# Patient Record
Sex: Female | Born: 1955 | Race: White | Hispanic: No | Smoking: Former smoker
Health system: Southern US, Community
[De-identification: ages and names within clinical notes are randomized; demographics above are authoritative.]

## PROBLEM LIST (undated history)

## (undated) DIAGNOSIS — F419 Anxiety disorder, unspecified: Secondary | ICD-10-CM

## (undated) DIAGNOSIS — I10 Essential (primary) hypertension: Secondary | ICD-10-CM

## (undated) HISTORY — PX: OTHER SURGICAL HISTORY: SHX169

---

## 2020-07-12 ENCOUNTER — Inpatient Hospital Stay (HOSPITAL_COMMUNITY): Payer: Medicare Other

## 2020-07-12 ENCOUNTER — Inpatient Hospital Stay (HOSPITAL_COMMUNITY)
Admission: AD | Admit: 2020-07-12 | Discharge: 2020-07-15 | DRG: 280 | Disposition: A | Discharge status: Home / Self Care | Payer: Medicare Other | Source: Other Acute Inpatient Hospital | Attending: Internal Medicine | Admitting: Internal Medicine

## 2020-07-12 ENCOUNTER — Encounter (HOSPITAL_COMMUNITY): Payer: Self-pay | Admitting: Internal Medicine

## 2020-07-12 DIAGNOSIS — I5022 Chronic systolic (congestive) heart failure: Secondary | ICD-10-CM | POA: Diagnosis present

## 2020-07-12 DIAGNOSIS — G9341 Metabolic encephalopathy: Secondary | ICD-10-CM | POA: Diagnosis present

## 2020-07-12 DIAGNOSIS — R079 Chest pain, unspecified: Secondary | ICD-10-CM

## 2020-07-12 DIAGNOSIS — D72829 Elevated white blood cell count, unspecified: Secondary | ICD-10-CM | POA: Diagnosis present

## 2020-07-12 DIAGNOSIS — R569 Unspecified convulsions: Secondary | ICD-10-CM | POA: Diagnosis not present

## 2020-07-12 DIAGNOSIS — I429 Cardiomyopathy, unspecified: Secondary | ICD-10-CM | POA: Diagnosis present

## 2020-07-12 DIAGNOSIS — I10 Essential (primary) hypertension: Secondary | ICD-10-CM

## 2020-07-12 DIAGNOSIS — F32A Depression, unspecified: Secondary | ICD-10-CM | POA: Diagnosis present

## 2020-07-12 DIAGNOSIS — R296 Repeated falls: Secondary | ICD-10-CM | POA: Diagnosis present

## 2020-07-12 DIAGNOSIS — M549 Dorsalgia, unspecified: Secondary | ICD-10-CM | POA: Diagnosis present

## 2020-07-12 DIAGNOSIS — Z87891 Personal history of nicotine dependence: Secondary | ICD-10-CM

## 2020-07-12 DIAGNOSIS — G894 Chronic pain syndrome: Secondary | ICD-10-CM | POA: Diagnosis present

## 2020-07-12 DIAGNOSIS — Z981 Arthrodesis status: Secondary | ICD-10-CM

## 2020-07-12 DIAGNOSIS — I214 Non-ST elevation (NSTEMI) myocardial infarction: Secondary | ICD-10-CM | POA: Diagnosis not present

## 2020-07-12 DIAGNOSIS — F10231 Alcohol dependence with withdrawal delirium: Secondary | ICD-10-CM

## 2020-07-12 DIAGNOSIS — R778 Other specified abnormalities of plasma proteins: Secondary | ICD-10-CM

## 2020-07-12 DIAGNOSIS — G4089 Other seizures: Secondary | ICD-10-CM | POA: Diagnosis present

## 2020-07-12 DIAGNOSIS — Z20822 Contact with and (suspected) exposure to covid-19: Secondary | ICD-10-CM | POA: Diagnosis present

## 2020-07-12 DIAGNOSIS — F13239 Sedative, hypnotic or anxiolytic dependence with withdrawal, unspecified: Secondary | ICD-10-CM | POA: Diagnosis not present

## 2020-07-12 DIAGNOSIS — E785 Hyperlipidemia, unspecified: Secondary | ICD-10-CM | POA: Diagnosis present

## 2020-07-12 DIAGNOSIS — F419 Anxiety disorder, unspecified: Secondary | ICD-10-CM | POA: Diagnosis present

## 2020-07-12 DIAGNOSIS — I4581 Long QT syndrome: Secondary | ICD-10-CM | POA: Diagnosis present

## 2020-07-12 DIAGNOSIS — R7989 Other specified abnormal findings of blood chemistry: Secondary | ICD-10-CM

## 2020-07-12 DIAGNOSIS — I110 Hypertensive heart disease with heart failure: Secondary | ICD-10-CM | POA: Diagnosis present

## 2020-07-12 DIAGNOSIS — F10931 Alcohol use, unspecified with withdrawal delirium: Secondary | ICD-10-CM | POA: Diagnosis present

## 2020-07-12 DIAGNOSIS — R9431 Abnormal electrocardiogram [ECG] [EKG]: Secondary | ICD-10-CM

## 2020-07-12 DIAGNOSIS — F1110 Opioid abuse, uncomplicated: Secondary | ICD-10-CM | POA: Diagnosis present

## 2020-07-12 HISTORY — DX: Essential (primary) hypertension: I10

## 2020-07-12 HISTORY — DX: Anxiety disorder, unspecified: F41.9

## 2020-07-12 LAB — CBC WITH DIFFERENTIAL/PLATELET
Abs Immature Granulocytes: 0.26 10*3/uL — ABNORMAL HIGH (ref 0.00–0.07)
Basophils Absolute: 0.1 10*3/uL (ref 0.0–0.1)
Basophils Relative: 0 %
Eosinophils Absolute: 0 10*3/uL (ref 0.0–0.5)
Eosinophils Relative: 0 %
HCT: 42.5 % (ref 36.0–46.0)
Hemoglobin: 13.7 g/dL (ref 12.0–15.0)
Immature Granulocytes: 1 %
Lymphocytes Relative: 13 %
Lymphs Abs: 2.6 10*3/uL (ref 0.7–4.0)
MCH: 32.2 pg (ref 26.0–34.0)
MCHC: 32.2 g/dL (ref 30.0–36.0)
MCV: 99.8 fL (ref 80.0–100.0)
Monocytes Absolute: 2.2 10*3/uL — ABNORMAL HIGH (ref 0.1–1.0)
Monocytes Relative: 11 %
Neutro Abs: 15.2 10*3/uL — ABNORMAL HIGH (ref 1.7–7.7)
Neutrophils Relative %: 75 %
Platelets: 268 10*3/uL (ref 150–400)
RBC: 4.26 MIL/uL (ref 3.87–5.11)
RDW: 13.2 % (ref 11.5–15.5)
WBC: 20.3 10*3/uL — ABNORMAL HIGH (ref 4.0–10.5)
nRBC: 0 % (ref 0.0–0.2)

## 2020-07-12 LAB — CBC
HCT: 41.6 % (ref 36.0–46.0)
Hemoglobin: 13.5 g/dL (ref 12.0–15.0)
MCH: 32.3 pg (ref 26.0–34.0)
MCHC: 32.5 g/dL (ref 30.0–36.0)
MCV: 99.5 fL (ref 80.0–100.0)
Platelets: 275 10*3/uL (ref 150–400)
RBC: 4.18 MIL/uL (ref 3.87–5.11)
RDW: 13.2 % (ref 11.5–15.5)
WBC: 16.9 10*3/uL — ABNORMAL HIGH (ref 4.0–10.5)
nRBC: 0 % (ref 0.0–0.2)

## 2020-07-12 LAB — ECHOCARDIOGRAM COMPLETE
AR max vel: 3.55 cm2
AV Area VTI: 3.12 cm2
AV Area mean vel: 3.38 cm2
AV Mean grad: 3 mmHg
AV Peak grad: 5.5 mmHg
Ao pk vel: 1.17 m/s
Area-P 1/2: 8.43 cm2
Calc EF: 33.9 %
Height: 64 in
MV M vel: 3.73 m/s
MV Peak grad: 55.7 mmHg
Radius: 0.3 cm
S' Lateral: 2.6 cm
Single Plane A2C EF: 22.6 %
Single Plane A4C EF: 35.7 %
Weight: 2613.77 oz

## 2020-07-12 LAB — COMPREHENSIVE METABOLIC PANEL
ALT: 25 U/L (ref 0–44)
AST: 36 U/L (ref 15–41)
Albumin: 3 g/dL — ABNORMAL LOW (ref 3.5–5.0)
Alkaline Phosphatase: 68 U/L (ref 38–126)
Anion gap: 9 (ref 5–15)
BUN: 20 mg/dL (ref 8–23)
CO2: 22 mmol/L (ref 22–32)
Calcium: 8.6 mg/dL — ABNORMAL LOW (ref 8.9–10.3)
Chloride: 108 mmol/L (ref 98–111)
Creatinine, Ser: 0.63 mg/dL (ref 0.44–1.00)
GFR, Estimated: >60 mL/min (ref >60)
Glucose, Bld: 151 mg/dL — ABNORMAL HIGH (ref 70–99)
Potassium: 3.7 mmol/L (ref 3.5–5.1)
Sodium: 139 mmol/L (ref 135–145)
Total Bilirubin: 1.7 mg/dL — ABNORMAL HIGH (ref 0.3–1.2)
Total Protein: 6.4 g/dL — ABNORMAL LOW (ref 6.5–8.1)

## 2020-07-12 LAB — HEPARIN LEVEL (UNFRACTIONATED)
Heparin Unfractionated: 0.44 IU/mL (ref 0.30–0.70)
Heparin Unfractionated: 0.31 IU/mL (ref 0.30–0.70)
Heparin Unfractionated: 0.21 IU/mL — ABNORMAL LOW (ref 0.30–0.70)

## 2020-07-12 LAB — RAPID URINE DRUG SCREEN, HOSP PERFORMED
Amphetamines: NOT DETECTED
Barbiturates: POSITIVE — AB
Benzodiazepines: POSITIVE — AB
Cocaine: NOT DETECTED
Opiates: NOT DETECTED
Tetrahydrocannabinol: NOT DETECTED

## 2020-07-12 LAB — LIPID PANEL
Cholesterol: 154 mg/dL (ref 0–200)
HDL: 48 mg/dL (ref >40)
LDL Cholesterol: 82 mg/dL (ref 0–99)
Total CHOL/HDL Ratio: 3.2 RATIO
Triglycerides: 120 mg/dL (ref <150)
VLDL: 24 mg/dL (ref 0–40)

## 2020-07-12 LAB — HIV ANTIBODY (ROUTINE TESTING W REFLEX): HIV Screen 4th Generation wRfx: NONREACTIVE

## 2020-07-12 LAB — SARS CORONAVIRUS 2 (TAT 6-24 HRS): SARS Coronavirus 2: NEGATIVE

## 2020-07-12 LAB — HEMOGLOBIN A1C
Hgb A1c MFr Bld: 6.4 % — ABNORMAL HIGH (ref 4.8–5.6)
Mean Plasma Glucose: 136.98 mg/dL

## 2020-07-12 LAB — TROPONIN I (HIGH SENSITIVITY): Troponin I (High Sensitivity): 338 ng/L (ref <18)

## 2020-07-12 LAB — TSH: TSH: 0.416 u[IU]/mL (ref 0.350–4.500)

## 2020-07-12 MED ORDER — CARVEDILOL 6.25 MG PO TABS
6.2500 mg | ORAL_TABLET | Freq: Two times a day (BID) | ORAL | Status: DC
Start: 2020-07-12 — End: 2020-07-12
  Administered 2020-07-12: 6.25 mg via ORAL
  Filled 2020-07-12: qty 1

## 2020-07-12 MED ORDER — POTASSIUM CHLORIDE CRYS ER 20 MEQ PO TBCR
40.0000 meq | EXTENDED_RELEASE_TABLET | Freq: Once | ORAL | Status: AC
  Administered 2020-07-12: 40 meq via ORAL
  Filled 2020-07-12: qty 2

## 2020-07-12 MED ORDER — HYDRALAZINE HCL 10 MG PO TABS
10.0000 mg | ORAL_TABLET | Freq: Three times a day (TID) | ORAL | Status: DC
  Administered 2020-07-12 – 2020-07-15 (×11): 10 mg via ORAL
  Filled 2020-07-12 (×11): qty 1

## 2020-07-12 MED ORDER — ATORVASTATIN CALCIUM 10 MG PO TABS
10.0000 mg | ORAL_TABLET | Freq: Every day | ORAL | Status: DC
  Administered 2020-07-12 – 2020-07-15 (×4): 10 mg via ORAL
  Filled 2020-07-12 (×4): qty 1

## 2020-07-12 MED ORDER — LORAZEPAM 2 MG/ML IJ SOLN
1.0000 mg | Freq: Once | INTRAMUSCULAR | Status: AC
  Administered 2020-07-12: 1 mg via INTRAVENOUS
  Filled 2020-07-12: qty 1

## 2020-07-12 MED ORDER — ACETAMINOPHEN 325 MG PO TABS
650.0000 mg | ORAL_TABLET | Freq: Four times a day (QID) | ORAL | Status: DC | PRN
  Administered 2020-07-13: 650 mg via ORAL
  Filled 2020-07-12: qty 2

## 2020-07-12 MED ORDER — NITROGLYCERIN 0.4 MG SL SUBL: 0.4000 mg | SUBLINGUAL_TABLET | SUBLINGUAL | Status: DC | PRN

## 2020-07-12 MED ORDER — LOSARTAN POTASSIUM 25 MG PO TABS
25.0000 mg | ORAL_TABLET | Freq: Every day | ORAL | Status: DC
  Administered 2020-07-12 – 2020-07-15 (×4): 25 mg via ORAL
  Filled 2020-07-12 (×3): qty 1

## 2020-07-12 MED ORDER — ASPIRIN EC 81 MG PO TBEC
81.0000 mg | DELAYED_RELEASE_TABLET | Freq: Every day | ORAL | Status: DC
  Administered 2020-07-12 – 2020-07-15 (×4): 81 mg via ORAL
  Filled 2020-07-12 (×4): qty 1

## 2020-07-12 MED ORDER — THIAMINE HCL 100 MG/ML IJ SOLN: 100.0000 mg | Freq: Every day | INTRAMUSCULAR | Status: DC

## 2020-07-12 MED ORDER — CARVEDILOL 12.5 MG PO TABS
12.5000 mg | ORAL_TABLET | Freq: Two times a day (BID) | ORAL | Status: DC
Start: 2020-07-12 — End: 2020-07-15
  Administered 2020-07-12 – 2020-07-15 (×6): 12.5 mg via ORAL
  Filled 2020-07-12 (×6): qty 1

## 2020-07-12 MED ORDER — HEPARIN (PORCINE) 25000 UT/250ML-% IV SOLN
1200.0000 [IU]/h | INTRAVENOUS | Status: AC
  Administered 2020-07-12: 900 [IU]/h via INTRAVENOUS
  Administered 2020-07-12: 950 [IU]/h via INTRAVENOUS
  Filled 2020-07-12 (×2): qty 250

## 2020-07-12 MED ORDER — HALOPERIDOL LACTATE 5 MG/ML IJ SOLN: 2.0000 mg | Freq: Four times a day (QID) | INTRAMUSCULAR | Status: DC | PRN

## 2020-07-12 MED ORDER — ADULT MULTIVITAMIN W/MINERALS CH
1.0000 | ORAL_TABLET | Freq: Every day | ORAL | Status: DC
  Administered 2020-07-13 – 2020-07-15 (×3): 1 via ORAL
  Filled 2020-07-12 (×3): qty 1

## 2020-07-12 MED ORDER — THIAMINE HCL 100 MG PO TABS
100.0000 mg | ORAL_TABLET | Freq: Every day | ORAL | Status: DC
  Administered 2020-07-13: 100 mg via ORAL
  Filled 2020-07-12: qty 1

## 2020-07-12 MED ORDER — MORPHINE SULFATE (PF) 2 MG/ML IV SOLN
1.0000 mg | INTRAVENOUS | Status: DC | PRN
  Administered 2020-07-12: 1 mg via INTRAVENOUS
  Filled 2020-07-12: qty 1

## 2020-07-12 MED ORDER — PERFLUTREN LIPID MICROSPHERE
1.0000 mL | INTRAVENOUS | Status: AC | PRN
  Administered 2020-07-12: 1 mL via INTRAVENOUS
  Filled 2020-07-12: qty 10

## 2020-07-12 MED ORDER — MAGNESIUM SULFATE 2 GM/50ML IV SOLN
2.0000 g | Freq: Once | INTRAVENOUS | Status: AC
  Administered 2020-07-12: 2 g via INTRAVENOUS
  Filled 2020-07-12: qty 50

## 2020-07-12 MED ORDER — LORAZEPAM 1 MG PO TABS
1.0000 mg | ORAL_TABLET | ORAL | Status: DC | PRN
  Administered 2020-07-13: 2 mg via ORAL
  Administered 2020-07-14: 4 mg via ORAL
  Administered 2020-07-14: 1 mg via ORAL
  Administered 2020-07-15: 4 mg via ORAL
  Administered 2020-07-15: 1 mg via ORAL
  Filled 2020-07-12 (×2): qty 1
  Filled 2020-07-12: qty 2
  Filled 2020-07-12 (×2): qty 4

## 2020-07-12 MED ORDER — ACETAMINOPHEN 650 MG RE SUPP: 650.0000 mg | Freq: Four times a day (QID) | RECTAL | Status: DC | PRN

## 2020-07-12 MED ORDER — LORAZEPAM 2 MG/ML IJ SOLN
1.0000 mg | INTRAMUSCULAR | Status: DC | PRN
Start: 2020-07-12 — End: 2020-07-15
  Administered 2020-07-12 – 2020-07-13 (×3): 2 mg via INTRAVENOUS
  Filled 2020-07-12 (×3): qty 1

## 2020-07-12 MED ORDER — FOLIC ACID 1 MG PO TABS
1.0000 mg | ORAL_TABLET | Freq: Every day | ORAL | Status: DC
  Administered 2020-07-13 – 2020-07-15 (×3): 1 mg via ORAL
  Filled 2020-07-12 (×4): qty 1

## 2020-07-12 NOTE — Progress Notes (Addendum)
MD - Please write order to continue foley.  Pt was admitted to [facility] with foley catheter in place, but no order.

## 2020-07-12 NOTE — Progress Notes (Signed)
EEG complete - results pending 

## 2020-07-12 NOTE — Progress Notes (Signed)
Patient admitted to 3e20 via [facility]. Pt attached to tele, on 2L O2. Pt disoriented to place and situation, constantly trying to get out of bed. Falls precautions in place. MD notified of patient arrival.

## 2020-07-12 NOTE — Progress Notes (Addendum)
Progress Note  Patient Name: Helen Mitchell Date of Encounter: 07/12/2020  [facility] Cardiologist: New to [facility]  Subjective   Slumped down in the bed. She thought she was at [facility] hospital but otherwise is oriented to time and person. She does seem confused, insisting we had met previously, though this is our first interaction. She denies recent chest pain, SOB, or palpitations. She recalls falling out of bed as the reason she was brought to the hospital. She attributes her ongoing shakiness to not receiving her xanax.   Inpatient Medications    Scheduled Meds: . aspirin EC  81 mg Oral Daily  . atorvastatin  10 mg Oral Daily  . carvedilol  6.25 mg Oral BID WC  . hydrALAZINE  10 mg Oral Q8H   Continuous Infusions: . heparin 900 Units/hr (07/12/20 0144)   PRN Meds: acetaminophen **OR** acetaminophen, haloperidol lactate, morphine injection, nitroGLYCERIN   Vital Signs    Vitals:   07/12/20 0047 07/12/20 0438 07/12/20 0821  BP: (!) 139/108 (!) 139/101 (!) 149/106  Pulse: (!) 107 (!) 116 (!) 117  Resp: (!) 21 18   Temp: 98.5 F (36.9 C) 98.4 F (36.9 C) 98 F (36.7 C)  TempSrc: Oral Oral Oral  SpO2: 91% 95% 93%  Weight: 74.1 kg    Height: '5\' 4"'  (1.626 m)      Intake/Output Summary (Last 24 hours) at 07/12/2020 4327 Last data filed at 07/12/2020 0100 Gross per 24 hour  Intake --  Output 100 ml  Net -100 ml   Last 3 Weights 07/12/2020  Weight (lbs) 163 lb 5.8 oz  Weight (kg) 74.1 kg      Telemetry    Sinus tachycardia with rates in the 110s-120s, occasional PVCs - Personally Reviewed  ECG    No new tracings - Personally Reviewed  Physical Exam   GEN: No acute distress.   Neck: No JVD Cardiac: tachycardic, regular rate, no murmurs, rubs, or gallops.  Respiratory: paradoxical breathing; she would not roll over or sit up for proper pulmonary exam due to back pain. No obvious wheezing, rhonchi, or rales anteriorly GI: Soft, obese, nontender,  non-distended  MS: No edema; No deformity. Neuro:  Nonfocal  Psych: Normal affect   Labs    High Sensitivity Troponin:   Recent Labs  Lab 07/12/20 0309  TROPONINIHS 338*      Chemistry Recent Labs  Lab 07/12/20 0309  NA 139  K 3.7  CL 108  CO2 22  GLUCOSE 151*  BUN 20  CREATININE 0.63  CALCIUM 8.6*  PROT 6.4*  ALBUMIN 3.0*  AST 36  ALT 25  ALKPHOS 68  BILITOT 1.7*  GFRNONAA >60  ANIONGAP 9     Hematology Recent Labs  Lab 07/12/20 0309  WBC 20.3*  RBC 4.26  HGB 13.7  HCT 42.5  MCV 99.8  MCH 32.2  MCHC 32.2  RDW 13.2  PLT 268    BNPNo results for input(s): BNP, PROBNP in the last 168 hours.   DDimer No results for input(s): DDIMER in the last 168 hours.   Radiology    DG CHEST PORT 1 VIEW  Result Date: 07/12/2020 CLINICAL DATA:  Chest pain EXAM: PORTABLE CHEST 1 VIEW COMPARISON:  07/10/2020 FINDINGS: Cardiac shadow is mildly enlarged. The lungs are well aerated bilaterally. No focal infiltrate or sizable effusion is seen. No pneumothorax is noted. Old rib fractures are noted. No acute abnormality seen. IMPRESSION: No acute abnormality noted. Electronically Signed   By: Elta Guadeloupe  Lukens M.D.   On: 07/12/2020 01:40    Cardiac Studies   Echocardiogram pending  Patient Profile     65 y.o. female with PMH of HTN, HLD, ETOH abuse, polysubstance abuse, and frequent falls, who transferred from [facility] to [facility] for the evaluation of elevated troponins after presenting post-fall from bed with reported generalized shaking and AMS.    Assessment & Plan    1. Elevated troponin in patient with no known CAD history: patient found to have HsTrop elevated to [phone] after a fall out of bed and possible seizure-like activity. No complaints of chest pain though reportedly noted some palpitations. EKG showed sinus tachycardia with marked anterolateral TWI and QTc 521. Suspect she is a poor candidate for invasive ischemic testing due to ongoing AMS, polysubstance  abuse, fall risk, and concerns for compliance. Favor conservative management of possible NSTEMI    - Echo pending to evaluate LV function and wall motion - Continue heparin gtt x 48 hours - Continue aspirin and statin - Continue carvedilol - Will add on FLP and A1C to morning labs for risk stratification  2. Prolonged QTc: elevated to 521 on EKG this admission. Home cymbalta on hold  - Continue to avoid QTc prolonging medications - would limit haldol is possible.  - Continue to monitor daily EKG - Will replete K 40 mEq this morning for K 3.7 - continue to monitor electrolytes and replete as needed to maintain K >4, Mg >2  3. HTN: BP persistently above goal.  - Continue home carvedilol for now - further titration pending above work-up.   4. HLD: no recent lipid panel on file - Will add on FLP to morning labs - Continue atorvastatin - low threshold to uptitrate if LDL >70 in light of #1  5. Leukocytosis: WBC up to 20 today. She has been afebrile. CXR without acute findings. UA/ UCx pending collection.  - Will defer infectious work-up to primary team  6. Polysubstance abuse: Utox positive for benzo's, opiates, and barbiturates. ETOH levels wnl. Amonia mildly elevated - Continue management per primary team.  For questions or updates, please contact [place] Please consult [email or web address] for contact info under     Signed, Abigail Butts, PA-C  07/12/2020, 9:37 AM    The patient was seen, examined and discussed with Abigail Butts, PA-C  and I agree with the above.   The patient became combative this morning and almost fell off the bed, she received Ativan, on physical exam she responds to questions but does not open her eyes, she is only oriented to herself.  Denies any chest pain or shortness of breath. She has no JVDs, S1-S2, no significant murmur, clear lungs, warm extremities with good peripheral pulses.  Assessment and plan:  Alcohol abuse, currently in  withdrawal -Withdrawal management per primary team, Ativan, fluids vitamin B1 strongly recommended  Polysubstance abuse  Troponin elevation  [phone] -This was found after seizure activity that can be related to alcohol withdrawal -I have reviewed her echocardiogram that shows akinesis in all of the apical segments and mid anteroseptal inferoseptal and anterior walls consistent with either occlusion or severe stenosis in proximal LAD, there is no definite apical thrombus however heavy smoke consistent with prethrombotic state -LVEF 30 to 35%.  Grade 2 diastolic dysfunction with elevated filling pressures. -Her EKG does show significant negative T waves in the anterolateral leads -The patient is currently asymptomatic, denies any chest pain or shortness of breath, she is currently actively withdrawing from  alcohol she is not a good candidate for cardiac catheterization and certainly not able to give [place] consent for that, however she will eventually need cardiac cath -I would complete 48-hour of heparin infusion and then consider full anticoagulation for prethrombotic state high risk of LV apical thrombus formation, depending on how her mental status evolves, risk and benefits of her compliance should be taken into consideration.  QT prolongation -QT/QTc 380/520 milliseconds -She was on multiple medications at home that could cause QT prolongation including Zofran, elitriptan, the patient is unable to provide any history if she taken any of those lately and how many, also an answer question about drug use, however avoid these in the hospital.  Hypertension -Increase carvedilol to 12.5 mg p.o. twice daily -Add losartan 25 mg daily.  Hyperlipidemia -Statin, LFTs are normal   Ena Dawley, MD 07/12/2020

## 2020-07-12 NOTE — Procedures (Signed)
HISTORY The patient is a 65 year old woman with a history of hypertension, chronic pain, polysubstance abuse, who is being evaluated for seizure-like episodes.   TECHNICAL This study was performed according to the 10-20 International System of Electrode Placement. This record was reviewed under the following settings: bandpass filters of 1-70 Hz, sensitivity of 7 uV/mm, a display speed of 30 mm/sec, with a 60 Hz notched filter applied as appropriate.   MEDICATIONS Keppra    FINDINGS This study was recorded in the awake state only. A posterior dominant background rhythm of 9-10 Hz is seen at times. Continuous frontotemporal muscle artifact and movement artifact obscures underlying activity throughout the entirety of the study.    HYPERVENTILATION: Not performed PHOTIC STIMULATION: Not performed   IMPRESSION This is an essentially nondiagnostic study recorded in the awake state only. Continuous frontotemporal muscle artifact and movement artifact obscures underlying cerebral activity. Consider repeating study when patient is able to cooperate.

## 2020-07-12 NOTE — Progress Notes (Addendum)
Update received from [facility]. Pt continued to be confused and combative. Versed 5mg  given during [facility] transport from [facility], after Haldol IM was given by [facility] staff.

## 2020-07-12 NOTE — H&P (Signed)
History and Physical    Helen Mitchell OHY:[id number] DOB: [birth year] DOA: 07/12/2020  PCP: Pcp, No  Patient coming from: Patient was transferred from [facility].  Chief Complaint: Confusion.  HPI: Helen Mitchell is a 65 y.o. female with history of hypertension, chronic pain and history of polysubstance abuse was brought to the ER after patient had a seizure-like episode as witnessed by patient's significant other.  In the ER patient was found to be confused and was given Ativan.  CT head was unremarkable ammonia level was around 41.  LFTs are mildly elevated.  Covid test was negative.  Chest x-ray did not show anything acute.  EKG showed diffuse T wave inversions in anterior leads.  Troponin was 400.  Patient urine drug screen was positive for opiates barbiturates benzodiazepine.  Patient was started on aspirin and heparin and was discussed with cardiologist on-call at [facility] and transferred for further management.  Patient did not complain of any chest pain but did complain of palpitations.    On my initial encounter with the patient patient is alert and awake and knows where she was and stated that she did not have chest pain but has having palpitation for the last few weeks.  Denies any shortness of breath nausea vomiting abdominal pain.  But did not exactly remember why she came to the ER.  He denies drinking any alcohol or using drugs.  Does take pain medications.    Patient was admitted last month on May 18, 2020 for stable burst fractures of the vertebra at [facility]  Shortly after admission patient slightly became confused and agitated was given Ativan.  ED Course: Patient is a direct admit.  Review of Systems: As per HPI, rest all negative.   Past Medical History:  Diagnosis Date  . Anxiety   . Hypertension     Past Surgical History:  Procedure Laterality Date  . spinal fusion       reports that she has quit smoking. She has never used smokeless tobacco. She reports  that she does not drink alcohol. No history on file for drug use.  Not on File  Family History  Problem Relation Age of Onset  . CAD Neg Hx     Prior to Admission medications   Not on File    Physical Exam: Constitutional: Moderately built and nourished. Vitals:   07/12/20 0047  BP: (!) 139/108  Pulse: (!) 107  Resp: (!) 21  Temp: 98.5 F (36.9 C)  TempSrc: Oral  SpO2: 91%  Weight: 74.1 kg  Height: 5\' 4"  (1.626 m)   Eyes: Anicteric no pallor. ENMT: No discharge from the ears eyes nose or mouth. Neck: No mass felt.  No neck rigidity. Respiratory: No rhonchi or crepitations. Cardiovascular: S1-S2 heard. Abdomen: Soft nontender bowel sounds present. Musculoskeletal: No edema. Skin: No rash. Neurologic: Alert awake oriented to time place and person.  Moves all extremities. Psychiatric: Appears normal.  Normal affect.   Labs on Admission: I have personally reviewed following labs and imaging studies  CBC: No results for input(s): WBC, NEUTROABS, HGB, HCT, MCV, PLT in the last 168 hours. Basic Metabolic Panel: No results for input(s): NA, K, CL, CO2, GLUCOSE, BUN, CREATININE, CALCIUM, MG, PHOS in the last 168 hours. GFR: CrCl cannot be calculated (No successful lab value found.). Liver Function Tests: No results for input(s): AST, ALT, ALKPHOS, BILITOT, PROT, ALBUMIN in the last 168 hours. No results for input(s): LIPASE, AMYLASE in the last 168 hours. No results  for input(s): AMMONIA in the last 168 hours. Coagulation Profile: No results for input(s): INR, PROTIME in the last 168 hours. Cardiac Enzymes: No results for input(s): CKTOTAL, CKMB, CKMBINDEX, TROPONINI in the last 168 hours. BNP (last 3 results) No results for input(s): PROBNP in the last 8760 hours. HbA1C: No results for input(s): HGBA1C in the last 72 hours. CBG: No results for input(s): GLUCAP in the last 168 hours. Lipid Profile: No results for input(s): CHOL, HDL, LDLCALC, TRIG, CHOLHDL,  LDLDIRECT in the last 72 hours. Thyroid Function Tests: No results for input(s): TSH, T4TOTAL, FREET4, T3FREE, THYROIDAB in the last 72 hours. Anemia Panel: No results for input(s): VITAMINB12, FOLATE, FERRITIN, TIBC, IRON, RETICCTPCT in the last 72 hours. Urine analysis: No results found for: COLORURINE, APPEARANCEUR, LABSPEC, PHURINE, GLUCOSEU, HGBUR, BILIRUBINUR, KETONESUR, PROTEINUR, UROBILINOGEN, NITRITE, LEUKOCYTESUR Sepsis Labs: @LABRCNTIP (procalcitonin:4,lacticidven:4) )No results found for this or any previous visit (from the past 240 hour(s)).   Radiological Exams on Admission: No results found.  EKG: Independently reviewed.  EKG done at [facility] shows normal sinus rhythm with diffuse T wave inversion anterior leads.  Assessment/Plan Principal Problem:   NSTEMI (non-ST elevated myocardial infarction) (HCC) Active Problems:   Elevated troponin   Essential hypertension   Non-STEMI (non-ST elevated myocardial infarction) (HCC)    1. Elevated troponins with diffuse T wave inversions in the anterior leads concerning for non-ST relation MI presently on heparin aspirin beta-blockers and statins.  Consult cardiology.  We will keep patient n.p.o. in anticipation of procedure. 2. Acute encephalopathy with concern for seizures.  On my exam patient was answering questions.  Will check MRI brain and EEG.  May need neurology input.  Presently patient is afebrile. 3. Hypertension on beta-blockers. 4. History of chronic pain we will need to verify home medications. 5. History of polysubstance abuse according to the chart.  We will need to contact patient's significant other when available to get further history. Patient's home medications needs to be verified.  Since patient has elevated troponin with diffuse T wave changes in the EKG with acute encephalopathy cause of which is not clear will need close monitoring and inpatient status.  All labs are pending   DVT prophylaxis: Heparin  infusion. Code Status: Full code. Family Communication: We will need to reach family when contact number is available. Disposition Plan: To be determined. Consults called: Cardiology. Admission status: Inpatient.   [facility] MD [facility] Pager [phone].  If 7PM-7AM, please contact night-coverage [email or web address] Password [email or web address]  07/12/2020, 1:05 AM

## 2020-07-12 NOTE — Progress Notes (Addendum)
ANTICOAGULATION CONSULT NOTE - Initial Consult  Pharmacy Consult for heparin Indication: chest pain/ACS  Not on File  Patient Measurements: Height: 5\' 4"  (162.6 cm) Weight: 74.1 kg (163 lb 5.8 oz) IBW/kg (Calculated) : 54.7 Heparin Dosing Weight: 70kg  Vital Signs: Temp: 98.5 F (36.9 C) (03/31 0047) Temp Source: Oral (03/31 0047) BP: 139/108 (03/31 0047) Pulse Rate: 107 (03/31 0047)  Medical History: Past Medical History:  Diagnosis Date  . Anxiety   . Hypertension     Assessment: 65yo female transferred to [facility] for further cardiac w/u, started on heparin by OSH for CP, to continue on transfer.  Labs from OSH: WBC 20.1, Hgb 15, Plt 271, SCr 0.67.  Goal of Therapy:  Heparin level 0.3-0.7 units/ml Monitor platelets by anticoagulation protocol: Yes   Plan:  Pt was given heparin 2500 units IV bolus followed by gtt at 900 units/hr; will continue for now and monitor heparin levels and CBC.  [facility], PharmD, BCPS  07/12/2020,1:11 AM   Addendum: Heparin level at goal, 0.44.  Will continue heparin at same rate and confirm stable with additional level.  VB 5:16 AM

## 2020-07-12 NOTE — Progress Notes (Signed)
[facility] night shift.  The staff reported that the patient has been restless.  She was given 1 mg of lorazepam IVP 0845.  There was a question about the use of benzodiazepines as the patient might be withdrawing from alprazolam, but also has a history of alcohol consumption.  The case was discussed with neurology.  Her EEG was nondiagnostic.  She is supposed to get MRI of brain during this hospitalization as well.  Her EKG showed QT interval prolongation so I am unable to consider haloperidol at this time and will order another milligram of lorazepam IVP.  Magnesium sulfate 2 g IVPB ordered for QT interval prolongation and history of alcohol use.  Sanda Klein, MD.

## 2020-07-12 NOTE — Consult Note (Signed)
[facility] Consult Note   Primary Physician: No PCP listed Primary Cardiologist:  NONE  Reason for Consultation: Elevated troponin  HPI:    Helen Mitchell is a 65 year old female who presented to [facility] ED with altered mental status.  Apparently she has a history of hypertension, hyperlipidemia, EtOH abuse, polysubstance abuse and frequent falls.  Patient has followed up in the pain clinic and takes Percocets 4 times per day for chronic pain.  Per patient's caregiver she was noted to fall out of bed and had some generalized shaking prior to coming to the hospital.  In the ED the patient continued to be agitated and restless.  She required multiple doses of Ativan.  Slightly increased ammonia level was noted on her labs.  The patient was transferred to [facility] for further care.  CT Head (07/10/20) - Negative High-sensitivity troponins were 462 and 737. Urine rapid drug screen was positive for benzodiazepines opiates, oxycodone and barbiturates. Ethanol <3.0 Other labs were: K 3.0, BUN 16, Cr 0.73, Gluc 186, WBC 18.7, Hct 49.7 and Plt 102. Chest x-ray showed no acute abnormality. ECG on 07/11/2020 (6:10 PM) -documented sinus tachycardia, low voltage QRS, poor R progression, and lateral T wave changes.   Home Medications Prior to Admission medications   Not on File    Past Medical History: Past Medical History:  Diagnosis Date  . Anxiety   . Hypertension     Past Surgical History: Past Surgical History:  Procedure Laterality Date  . spinal fusion      Family History: Family History  Problem Relation Age of Onset  . CAD Neg Hx     Social History: Social History   Socioeconomic History  . Marital status: Divorced    Spouse name: Not on file  . Number of children: Not on file  . Years of education: Not on file  . Highest education level: Not on file  Occupational History  . Not on file  Tobacco Use  . Smoking status: Former Games developer  .  Smokeless tobacco: Never Used  Substance and Sexual Activity  . Alcohol use: Never  . Drug use: Not on file  . Sexual activity: Not on file  Other Topics Concern  . Not on file  Social History Narrative  . Not on file   Social Determinants of Health   Financial Resource Strain: Not on file  Food Insecurity: Not on file  Transportation Needs: Not on file  Physical Activity: Not on file  Stress: Not on file  Social Connections: Not on file    Allergies:  Not on File   Review of Systems: [y] = yes, [ ]  = no   . General: Weight gain [ ] ; Weight loss [ ] ; Anorexia [ ] ; Fatigue [ ] ; Fever [ ] ; Chills [ ] ; Weakness [ ]   . Cardiac: Chest pain/pressure [ ] ; Resting SOB [ ] ; Exertional SOB [ ] ; Orthopnea [ ] ; Pedal Edema [ ] ; Palpitations [ ] ; Syncope [ ] ; Presyncope [ ] ; Paroxysmal nocturnal dyspnea[ ]   . Pulmonary: Cough [ ] ; Wheezing[ ] ; Hemoptysis[ ] ; Sputum [ ] ; Snoring [ ]   . GI: Vomiting[ ] ; Dysphagia[ ] ; Melena[ ] ; Hematochezia [ ] ; Heartburn[ ] ; Abdominal pain [ ] ; Constipation [ ] ; Diarrhea [ ] ; BRBPR [ ]   . GU: Hematuria[ ] ; Dysuria [ ] ; Nocturia[ ]   . Vascular: Pain in legs with walking [ ] ; Pain in feet with lying flat [ ] ; Non-healing sores [ ] ; Stroke [ ] ;  TIA [ ] ; Slurred speech [ ] ;  . Neuro: Headaches[ ] ; Vertigo[ ] ; Seizures[ ] ; Paresthesias[ ] ;Blurred vision [ ] ; Diplopia [ ] ; Vision changes [ ]   . Ortho/Skin: Arthritis [ ] ; Joint pain [ ] ; Muscle pain [ ] ; Joint swelling [ ] ; Back Pain [ ] ; Rash [ ]   . Psych: Depression[ ] ; Anxiety[ ] : Altered Mental Status [Y]  . Heme: Bleeding problems [ ] ; Clotting disorders [ ] ; Anemia [ ]   . Endocrine: Diabetes [ ] ; Thyroid dysfunction[ ]      Objective:    Vital Signs:   Temp:  [98.5 F (36.9 C)] 98.5 F (36.9 C) (03/31 0047) Pulse Rate:  [107] 107 (03/31 0047) Resp:  [21] 21 (03/31 0047) BP: (139)/(108) 139/108 (03/31 0047) SpO2:  [91 %] 91 % (03/31 0047) Weight:  [74.1 kg] 74.1 kg (03/31 0047)    Weight  change: Filed Weights   07/12/20 0047  Weight: 74.1 kg    Intake/Output:   Intake/Output Summary (Last 24 hours) at 07/12/2020 0242 Last data filed at 07/12/2020 0100 Gross per 24 hour  Intake --  Output 100 ml  Net -100 ml      Physical Exam    General: Restless, confused HEENT: normal Neck: supple. JVP . Carotids 2+ bilat; no bruits. No lymphadenopathy or thyromegaly appreciated. Cor: Regular rate rhythm, no murmurs Lungs: Clear to auscultation bilaterally Abdomen: soft, nondistended. No hepatosplenomegaly. No bruits or masses. Extremities: no cyanosis, clubbing, rash, edema Neuro: Nonfocal exam    Labs   Basic Metabolic Panel: No results for input(s): NA, K, CL, CO2, GLUCOSE, BUN, CREATININE, CALCIUM, MG, PHOS in the last 168 hours.  Liver Function Tests: No results for input(s): AST, ALT, ALKPHOS, BILITOT, PROT, ALBUMIN in the last 168 hours. No results for input(s): LIPASE, AMYLASE in the last 168 hours. No results for input(s): AMMONIA in the last 168 hours.  CBC: No results for input(s): WBC, NEUTROABS, HGB, HCT, MCV, PLT in the last 168 hours.  Cardiac Enzymes: No results for input(s): CKTOTAL, CKMB, CKMBINDEX, TROPONINI in the last 168 hours.  BNP: BNP (last 3 results) No results for input(s): BNP in the last 8760 hours.  ProBNP (last 3 results) No results for input(s): PROBNP in the last 8760 hours.   CBG: No results for input(s): GLUCAP in the last 168 hours.  Coagulation Studies: No results for input(s): LABPROT, INR in the last 72 hours.   Imaging   DG CHEST PORT 1 VIEW  Result Date: 07/12/2020 CLINICAL DATA:  Chest pain EXAM: PORTABLE CHEST 1 VIEW COMPARISON:  07/10/2020 FINDINGS: Cardiac shadow is mildly enlarged. The lungs are well aerated bilaterally. No focal infiltrate or sizable effusion is seen. No pneumothorax is noted. Old rib fractures are noted. No acute abnormality seen. IMPRESSION: No acute abnormality noted. Electronically  Signed   By: M.D.   On: 07/12/2020 01:40      Medications:     Current Medications: . aspirin EC  81 mg Oral Daily  . atorvastatin  10 mg Oral Daily  . carvedilol  6.25 mg Oral BID WC  . hydrALAZINE  10 mg Oral Q8H     Infusions: . heparin 900 Units/hr (07/12/20 0144)       Assessment/Plan   1. Elevated troponin  Patient presents to the hospital with altered mental status.  There is a prior history of alcohol and polysubstance use.  There is some documentation of seizure-like activity at home though in the hospital she did not exhibit  this. No reports of chest pain. High-sensitivity troponins were drawn and were abnormal (462 and 737). ECG with T wave inversions in anterolateral leads. Also QTc 521 msec  -Continue to trend enzymes -Serial ECGs -ASA 81 mg daily -Check a lipid panel -Continue statins -Optimize BP control, resume home dose of carvedilol -Consider unfractionated heparin IV infusion for 48 hours -Echo to evaluate LV function  2. QT prolongation  -Maintain serum K>4.0 and Mg>2.0 -Monitor on telemetry -Avoid QT prolonging medications    Lonie Peak, MD  07/12/2020, 2:42 AM  Cardiology Overnight Team Please contact [facility] Cardiology for night-coverage after hours (4p -7a ) and weekends on [email or web address]

## 2020-07-12 NOTE — Progress Notes (Signed)
Pt admitted to 3e20 from [facility]. [facility] admitting paged.

## 2020-07-12 NOTE — Consult Note (Signed)
Neurology Consultation  Reason for Consult: AMS and concern for seizures Referring Physician: Dorcas Carrow, MD  CC: Confusion  History is obtained from: chart and patient   HPI: Danise Dehne is a 65 y.o. female hypertension, chronic pain and history of polysubstance abuse. Mrs Jonita Albee presented to [facility] after a seizure like episode described below.   3/29- [facility] HPI "Vernell Back is a 65 y.o. female with PMHx as reviewed in the EMR that presented to [facility] and is being admitted for Altered mental status. Per EMS pt's caregiver/friend witnessed pt having a seizure and she fell out of bed. Caregiver states that she was asleep and woke up with generalized shaking around 1 AM, rolled over and then fell out of bed. He states pt had a seizure in February and was brought here, she followed up with her PCP who did not feel need for her to be started on antiseizure medications. On ER work-up CT showed no acute abnormality and urine drug screen positive for opiates, barbiturates, benzodiazepine and oxycodone. On my encounter patient is less restless and keeps asking to go home. She does know that she is at the hospital but could not tell me the year or month." Patient was provided a loading dose of Keppra; however Keppra was not continued as there was low suspicion for organic seizures.  Patient was noted to have elevated troponin with EKG changes and was  transferred from [facility] for further cardiac management.  On exam today patient is alert to person, time, place, and situation. Patient reports that she has no never had seizures prior to 2 months ago. Patient reports that she did have a seizure in 05/2020 that resulted in her being in the hospital for 5 days. On further chart review patient did report to [facility] in 05/2020, but EMR does not report seizure and references LOC after a fall resulting in closed compression fracture. Per EMR there is no other evidence of seizure  like events. However her boyfriend/caretaker told admitting staff at [facility] that the patient was recommended to follow up with her OP physician after the 05/2020 hospitalization, and he did not feel the need to start AEDs. Patient reports that her only medication change was discontinuation of her chronic Xanax for anxiety approximately 2 months ago. Patient reports that she has not taken it since her PCP discontinued. However, patient UDS at [facility] was positive for benzo, opiates, oxy, and barbiturates. Patient was adamant that she did not know where the benzo and barbiturates would have come from.  Patient endorses that she feels better today.    ROS: A 14 point ROS was performed and is negative except as noted in the HPI.   Past Medical History:  Diagnosis Date  . Anxiety   . Hypertension      Family History  Problem Relation Age of Onset  . CAD Neg Hx     Social History:   reports that she has quit smoking. She has never used smokeless tobacco. She reports that she does not drink alcohol. No history on file for drug use.  Medications  Current Facility-Administered Medications:  .  acetaminophen (TYLENOL) tablet 650 mg, 650 mg, Oral, Q6H PRN **OR** acetaminophen (TYLENOL) suppository 650 mg, 650 mg, Rectal, Q6H PRN, Eduard Clos, MD .  aspirin EC tablet 81 mg, 81 mg, Oral, Daily, Eduard Clos, MD, 81 mg at 07/12/20 5916 .  atorvastatin (LIPITOR) tablet 10 mg, 10 mg, Oral, Daily,  Eduard Clos, MD, 10 mg at 07/12/20 1443 .  carvedilol (COREG) tablet 12.5 mg, 12.5 mg, Oral, BID WC, Tobias Alexander H, MD .  heparin ADULT infusion 100 units/mL (25000 units/243mL), 900 Units/hr, Intravenous, Continuous, Bryk, Veronda P, RPH, Last Rate: 9 mL/hr at 07/12/20 1052, 900 Units/hr at 07/12/20 1052 .  hydrALAZINE (APRESOLINE) tablet 10 mg, 10 mg, Oral, Q8H, Eduard Clos, MD, 10 mg at 07/12/20 0453 .  losartan (COZAAR) tablet 25 mg, 25 mg, Oral, Daily, Tobias Alexander H, MD .  morphine 2 MG/ML injection 1 mg, 1 mg, Intravenous, Q2H PRN, Eduard Clos, MD, 1 mg at 07/12/20 1540 .  nitroGLYCERIN (NITROSTAT) SL tablet 0.4 mg, 0.4 mg, Sublingual, Q5 min PRN, Eduard Clos, MD .  potassium chloride SA (KLOR-CON) CR tablet 40 mEq, 40 mEq, Oral, Once, Kroeger, Krista M., PA-C   Exam: Current vital signs: BP (!) 149/106   Pulse (!) 117   Temp 98 F (36.7 C) (Oral)   Resp 18   Ht 5\' 4"  (1.626 m)   Wt 74.1 kg   SpO2 93%   BMI 28.04 kg/m  Vital signs in last 24 hours: Temp:  [98 F (36.7 C)-98.5 F (36.9 C)] 98 F (36.7 C) (03/31 0821) Pulse Rate:  [107-117] 117 (03/31 0821) Resp:  [18-21] 18 (03/31 0438) BP: (139-149)/(101-108) 149/106 (03/31 0821) SpO2:  [91 %-95 %] 93 % (03/31 0821) Weight:  [74.1 kg] 74.1 kg (03/31 0047)  GENERAL: Awake, alert in NAD HEENT: - Normocephalic and atraumatic, dry mm,  LUNGS - Normal respiratory effort. SaO2 CV - RRR on tele ABDOMEN - Soft, nontender Ext: warm, well perfused  NEURO:  Mental Status: AA&O to person, place, time, and situation Speech/Language: speech is clear and coherent.   fluency, and comprehension intact. Cranial Nerves:  II: PERRL_2___mm/brisk. visual fields full.  III, IV, VI: EOMI. Lid elevation symmetric and full.  V: Sensation is intact to light touch and symmetrical to face. Blinks to threat.  VII: Face is symmetric resting and smiling. Able to puff cheeks and raise eyebrows.  VIII: Hearing intact to voice IX, X: Palate elevation is symmetric. Phonation normal.  XI: Normal sternocleidomastoid and trapezius muscle strength XII: Tongue protrudes midline without fasciculations.   Motor: 5/5 strength is all muscle groups.  Tone is normal. Bulk is normal.  Sensation- Intact to light touch bilaterally in all four extremities Coordination: FTN intact bilaterally. DTRs: 2+ throughout.  Gait- deferred, patient on EEG   Labs I have reviewed labs in epic and the  results pertinent to this consultation are:   CBC    Component Value Date/Time   WBC 20.3 (H) 07/12/2020 0309   RBC 4.26 07/12/2020 0309   HGB 13.7 07/12/2020 0309   HCT 42.5 07/12/2020 0309   PLT 268 07/12/2020 0309   MCV 99.8 07/12/2020 0309   MCH 32.2 07/12/2020 0309   MCHC 32.2 07/12/2020 0309   RDW 13.2 07/12/2020 0309   LYMPHSABS 2.6 07/12/2020 0309   MONOABS 2.2 (H) 07/12/2020 0309   EOSABS 0.0 07/12/2020 0309   BASOSABS 0.1 07/12/2020 0309    CMP     Component Value Date/Time   NA 139 07/12/2020 0309   K 3.7 07/12/2020 0309   CL 108 07/12/2020 0309   CO2 22 07/12/2020 0309   GLUCOSE 151 (H) 07/12/2020 0309   BUN 20 07/12/2020 0309   CREATININE 0.63 07/12/2020 0309   CALCIUM 8.6 (L) 07/12/2020 0309   PROT 6.4 (L) 07/12/2020 0309  ALBUMIN 3.0 (L) 07/12/2020 0309   AST 36 07/12/2020 0309   ALT 25 07/12/2020 0309   ALKPHOS 68 07/12/2020 0309   BILITOT 1.7 (H) 07/12/2020 0309   GFRNONAA >60 07/12/2020 0309    Lipid Panel     Component Value Date/Time   CHOL 154 07/12/2020 1037   TRIG 120 07/12/2020 1037   HDL 48 07/12/2020 1037   CHOLHDL 3.2 07/12/2020 1037   VLDL 24 07/12/2020 1037   LDLCALC 82 07/12/2020 1037     Imaging EEG pending MRI Brain pending   Impression: Mrs. Mauch is a 65 yo patient who presents with concern for seizure-like event. Patient UDS on her initial presentation was positive to suggest polypharmacy. Patient endorses recent discontinuation of Xanax however her UDS was positive for benzo's. If patient is stopping and starting benzos this is placing her at increased risk for seizures. This is also true for barbiturates as withdrawal from barbiturates also increases risk for seizures. Patient appears to be clearing some of her medications (prescribed opioids) and her mental status appears to be improving although it may be waxing and waning. Patient will likely continue to improve as she clears these substances. Will still await MRI  and EEG results as patient has had recent falls and a hematoma has been seen on prior head imaging that could increase risk for non polypharmacy  Induced seizures as well.   Recommendations: - EEG pending, start AEDs if ictal episodes seen - MRI Brain, pending - CIWA protocol - Delirium precautions  Seizure precautions:  Per [facility] statutes, patients with seizures are not allowed to drive until they have been seizure-free for six months and cleared by a physician    Use caution when using heavy equipment or power tools. Avoid working on ladders or at heights. Take showers instead of baths. Ensure the water temperature is not too high on the home water heater. Do not go swimming alone. Do not lock yourself in a room alone (i.e. bathroom). When caring for infants or small children, sit down when holding, feeding, or changing them to minimize risk of injury to the child in the event you have a seizure. Maintain good sleep hygiene. Avoid alcohol.    If patient has another seizure, call 911 and bring them back to the ED if: A.  The seizure lasts longer than 5 minutes.      B.  The patient doesn't wake shortly after the seizure or has new problems such as difficulty seeing, speaking or moving following the seizure C.  The patient was injured during the seizure D.  The patient has a temperature over 102 F (39C) E.  The patient vomited during the seizure and now is having trouble breathing    During the Seizure   - First, ensure adequate ventilation and place patients on the floor on their left side  Loosen clothing around the neck and ensure the airway is patent. If the patient is clenching the teeth, do not force the mouth open with any object as this can cause severe damage - Remove all items from the surrounding that can be hazardous. The patient may be oblivious to what's happening and may not even know what he or she is doing. If the patient is confused and wandering, either gently  guide him/her away and block access to outside areas - Reassure the individual and be comforting - Call 911. In most cases, the seizure ends before EMS arrives. However, there are cases when seizures may  last over 3 to 5 minutes. Or the individual may have developed breathing difficulties or severe injuries. If a pregnant patient or a person with diabetes develops a seizure, it is prudent to call an ambulance. - Finally, if the patient does not regain full consciousness, then call EMS. Most patients will remain confused for about 45 to 90 minutes after a seizure, so you must use judgment in calling for help. - Avoid restraints but make sure the patient is in a bed with padded side rails - Place the individual in a lateral position with the neck slightly flexed; this will help the saliva drain from the mouth and prevent the tongue from falling backward - Remove all nearby furniture and other hazards from the area - Provide verbal assurance as the individual is regaining consciousness - Provide the patient with privacy if possible - Call for help and start treatment as ordered by the caregiver    After the Seizure (Postictal Stage)   After a seizure, most patients experience confusion, fatigue, muscle pain and/or a headache. Thus, one should permit the individual to sleep. For the next few days, reassurance is essential. Being calm and helping reorient the person is also of importance.   Most seizures are painless and end spontaneously. Seizures are not harmful to others but can lead to complications such as stress on the lungs, brain and the heart. Individuals with prior lung problems may develop labored breathing and respiratory distress.        Eliseo GumJai Jaonna Word, MD PGY-1

## 2020-07-12 NOTE — Progress Notes (Signed)
Patient seen and examined.  Admitted early morning hours by nighttime hospitalist.  I reviewed her records.  I did talk to her boyfriend and caretaker [name] on the phone as well as her daughter Ms. Tori at the bedside.  Patient had been intermittently confused and impulsive.  65 year old female with history of hypertension, chronic pain syndrome on chronic pain management with Percocet, previously on Xanax that was stopped about a month ago and is still intermittently taking, on TCA and SSRI had witnessed seizure-like activity at home and 1 more similar episode at  [facility].  Patient was confused.  She only tells [place] that she fell off the bed.  On initial presentation to emergency room, patient was found to have minimally elevated troponins, started on heparin drip and transferred to [facility].  Patient did have multiple falls, recent 1 about a month ago resulting in vertebral fractures.  On board pain management since then.  Patient denies any use of phenobarbital, street drugs or alcohol.  #1. non-ST elevation MI: Diffuse T wave changes on presentation.  Patient with no history of cardiac stent.  Chest pain-free.  Followed by cardiology. Plan for heparinization for 48 hours, no cardiac cath planned. Resumed her home doses of aspirin and statin as well as beta-blockers.  Mobilize. Echocardiogram pending.  #2.  Suspected seizure/postictal/polypharmacy: Witnessed seizure-like activity, will rule out seizure.  Possible Xanax withdrawal. Seizure precautions. MRI today, EEG today.  Discussed with neurology for recommendations. Will not resume any benzodiazepines. Patient is on long-term TCA and Cymbalta, will resume.  Resume Percocet on lower doses, she is on chronic pain management. Symptomatic treatment for confusion agitation, possible postictal.  Fall precautions.

## 2020-07-12 NOTE — Progress Notes (Signed)
ANTICOAGULATION CONSULT NOTE - Follow Up Consult  Pharmacy Consult for Heparin Indication: chest pain/ACS  No Known Allergies  Patient Measurements: Height: 5\' 4"  (162.6 cm) Weight: 74.1 kg (163 lb 5.8 oz) IBW/kg (Calculated) : 54.7 Heparin Dosing Weight: 70 kg  Vital Signs: Temp: 97.8 F (36.6 C) (03/31 2337) Temp Source: Oral (03/31 2337) BP: 112/82 (03/31 2337) Pulse Rate: 104 (03/31 2337)  Labs: Recent Labs    07/12/20 0309 07/12/20 1037 07/12/20 2308  HGB 13.7  --  13.5  HCT 42.5  --  41.6  PLT 268  --  275  HEPARINUNFRC 0.44 0.31 0.21*  CREATININE 0.63  --   --   TROPONINIHS 338*  --   --     Estimated Creatinine Clearance: 68.3 mL/min (by C-G formula based on SCr of 0.63 mg/dL).   Assessment: 65yo female transferred to [facility] for further cardiac w/u, started on heparin by OSH for CP, which was continued on transfer last night/early AM.    Heparin level down to subtherapeutic (0.21) on gtt at 950 units/hr. RN states that pt has been pulling out line all day long and during this shift. Heparin currently restarted and running. No bleeding noted.  Goal of Therapy:  Heparin level 0.3-0.7 units/ml Monitor platelets by anticoagulation protocol: Yes   Plan:  Continue heparin gtt at 950 units/hr Will f/u 6 hr heparin level  [facility], PharmD, BCPS Please see amion for complete clinical pharmacist phone list 07/12/2020,11:57 PM

## 2020-07-12 NOTE — Progress Notes (Signed)
ANTICOAGULATION CONSULT NOTE - Follow Up Consult  Pharmacy Consult for Heparin Indication: chest pain/ACS  No Known Allergies  Patient Measurements: Height: 5\' 4"  (162.6 cm) Weight: 74.1 kg (163 lb 5.8 oz) IBW/kg (Calculated) : 54.7 Heparin Dosing Weight: 70 kg  Vital Signs: Temp: 98 F (36.7 C) (03/31 1214) Temp Source: Oral (03/31 1214) BP: 142/98 (03/31 1214) Pulse Rate: 109 (03/31 1214)  Labs: Recent Labs    07/12/20 0309 07/12/20 1037  HGB 13.7  --   HCT 42.5  --   PLT 268  --   HEPARINUNFRC 0.44 0.31  CREATININE 0.63  --   TROPONINIHS 338*  --     Estimated Creatinine Clearance: 68.3 mL/min (by C-G formula based on SCr of 0.63 mg/dL).   Medications:  Medications Prior to Admission  Medication Sig Dispense Refill Last Dose  . alendronate (FOSAMAX) 70 MG tablet Take 70 mg by mouth once a week. Saturday   07/07/2020  . b complex vitamins capsule Take 1 capsule by mouth daily.   07/09/2020  . Biotin 10 MG TABS Take 10 mg by mouth daily at 6 (six) AM.   07/09/2020  . carvedilol (COREG) 6.25 MG tablet Take 6.25 mg by mouth 2 (two) times daily.   07/09/2020 at 2000  . Cholecalciferol (VITAMIN D) 125 MCG (5000 UT) CAPS Take 5,000 Units by mouth daily.   07/09/2020  . DULoxetine (CYMBALTA) 60 MG capsule Take 60 mg by mouth 2 (two) times daily.   07/09/2020  . eletriptan (RELPAX) 40 MG tablet Take 40 mg by mouth 2 (two) times daily as needed for migraine.   Past Month at Unknown time  . hydrochlorothiazide (MICROZIDE) 12.5 MG capsule Take 12.5 mg by mouth daily.   07/09/2020  . lactulose (CHRONULAC) 10 GM/15ML solution Take 10 g by mouth 2 (two) times daily.   07/07/2020  . ondansetron (ZOFRAN-ODT) 4 MG disintegrating tablet Take 4 mg by mouth 2 (two) times daily as needed for nausea.   Past Month at Unknown time  . oxyCODONE-acetaminophen (PERCOCET) 10-325 MG tablet Take 1 tablet by mouth 5 (five) times daily as needed for pain.   07/09/2020  . oxymetazoline (AFRIN) 0.05 % nasal  spray Place 1 spray into both nostrils 2 (two) times daily as needed for congestion.   Past Month at Unknown time  . rosuvastatin (CRESTOR) 10 MG tablet Take 10 mg by mouth daily.   07/09/2020  . tiZANidine (ZANAFLEX) 4 MG tablet Take 4 mg by mouth 4 (four) times daily as needed for muscle spasms.   07/09/2020  . VENTOLIN HFA 108 (90 Base) MCG/ACT inhaler Inhale 2 puffs into the lungs every 4 (four) hours as needed for shortness of breath.   07/09/2020    Assessment: 65yo female transferred to [facility] for further cardiac w/u, started on heparin by OSH for CP, which was continued on transfer last night/early AM.   Initial heparin level post start of hepairn drip was therapeutic.   Second confirmatory heparin level is 0.31 remains therapeutic on heparin drip 900 units/hr. Heparin level did decrease from 0.44 to 0.31, low end of goal thus will bump heparin rate up slightly some to keep HL within goal.  No bleeding noted. CBC within normal   Goal of Therapy:  Heparin level 0.3-0.7 units/ml Monitor platelets by anticoagulation protocol: Yes   Plan:  Increase heparin drip rate to 950 units/hr  Daily heparin level and CBC Monitor for signs and symptoms of bleeding  Thank you for allowing pharmacy  to be part of this patients care team.  Noah Delaine, RPh Clinical Pharmacist [phone] Please check AMION for all [facility] phone numbers After 10:00 PM, call Main Pharmacy [phone] 07/12/2020,1:06 PM

## 2020-07-12 NOTE — Progress Notes (Signed)
COVID-19 PCR and Respiratory Pathogen Panel negative on 07/10/20 at 03:01am per medical chart from [facility].

## 2020-07-13 ENCOUNTER — Inpatient Hospital Stay (HOSPITAL_COMMUNITY): Payer: Medicare Other

## 2020-07-13 DIAGNOSIS — I214 Non-ST elevation (NSTEMI) myocardial infarction: Secondary | ICD-10-CM | POA: Diagnosis not present

## 2020-07-13 LAB — PHOSPHORUS
Phosphorus: 2.2 mg/dL — ABNORMAL LOW (ref 2.5–4.6)
Phosphorus: 2.2 mg/dL — ABNORMAL LOW (ref 2.5–4.6)

## 2020-07-13 LAB — COMPREHENSIVE METABOLIC PANEL
ALT: 33 U/L (ref 0–44)
AST: 54 U/L — ABNORMAL HIGH (ref 15–41)
Albumin: 3.1 g/dL — ABNORMAL LOW (ref 3.5–5.0)
Alkaline Phosphatase: 69 U/L (ref 38–126)
Anion gap: 11 (ref 5–15)
BUN: 22 mg/dL (ref 8–23)
CO2: 22 mmol/L (ref 22–32)
Calcium: 9 mg/dL (ref 8.9–10.3)
Chloride: 106 mmol/L (ref 98–111)
Creatinine, Ser: 0.7 mg/dL (ref 0.44–1.00)
GFR, Estimated: >60 mL/min (ref >60)
Glucose, Bld: 132 mg/dL — ABNORMAL HIGH (ref 70–99)
Potassium: 3.6 mmol/L (ref 3.5–5.1)
Sodium: 139 mmol/L (ref 135–145)
Total Bilirubin: 1.6 mg/dL — ABNORMAL HIGH (ref 0.3–1.2)
Total Protein: 6.5 g/dL (ref 6.5–8.1)

## 2020-07-13 LAB — HEPARIN LEVEL (UNFRACTIONATED)
Heparin Unfractionated: 0.25 IU/mL — ABNORMAL LOW (ref 0.30–0.70)
Heparin Unfractionated: 0.35 IU/mL (ref 0.30–0.70)
Heparin Unfractionated: 0.27 IU/mL — ABNORMAL LOW (ref 0.30–0.70)

## 2020-07-13 LAB — VITAMIN B12: Vitamin B-12: 395 pg/mL (ref 180–914)

## 2020-07-13 LAB — AMMONIA: Ammonia: 26 umol/L (ref 9–35)

## 2020-07-13 LAB — MAGNESIUM
Magnesium: 3.2 mg/dL — ABNORMAL HIGH (ref 1.7–2.4)
Magnesium: 2.5 mg/dL — ABNORMAL HIGH (ref 1.7–2.4)

## 2020-07-13 MED ORDER — VITAMIN D 25 MCG (1000 UNIT) PO TABS
5000.0000 [IU] | ORAL_TABLET | Freq: Every day | ORAL | Status: DC
  Administered 2020-07-13 – 2020-07-15 (×3): 5000 [IU] via ORAL
  Filled 2020-07-13 (×3): qty 5

## 2020-07-13 MED ORDER — DULOXETINE HCL 60 MG PO CPEP: 60.0000 mg | ORAL_CAPSULE | Freq: Two times a day (BID) | ORAL | Status: DC

## 2020-07-13 MED ORDER — OXYMETAZOLINE HCL 0.05 % NA SOLN
1.0000 | Freq: Two times a day (BID) | NASAL | Status: DC | PRN
  Filled 2020-07-13: qty 30

## 2020-07-13 MED ORDER — LACTULOSE 10 GM/15ML PO SOLN
10.0000 g | Freq: Two times a day (BID) | ORAL | Status: DC
  Administered 2020-07-13 – 2020-07-15 (×5): 10 g via ORAL
  Filled 2020-07-13 (×5): qty 15

## 2020-07-13 MED ORDER — THIAMINE HCL 100 MG/ML IJ SOLN
500.0000 mg | Freq: Three times a day (TID) | INTRAVENOUS | Status: DC
  Administered 2020-07-13 – 2020-07-14 (×6): 500 mg via INTRAVENOUS
  Filled 2020-07-13 (×8): qty 5

## 2020-07-13 MED ORDER — POTASSIUM CHLORIDE CRYS ER 20 MEQ PO TBCR
40.0000 meq | EXTENDED_RELEASE_TABLET | Freq: Every day | ORAL | Status: DC
  Administered 2020-07-13 – 2020-07-15 (×3): 40 meq via ORAL
  Filled 2020-07-13 (×3): qty 2

## 2020-07-13 MED ORDER — OXYCODONE-ACETAMINOPHEN 5-325 MG PO TABS
1.0000 | ORAL_TABLET | Freq: Three times a day (TID) | ORAL | Status: DC | PRN
Start: 2020-07-13 — End: 2020-07-14
  Administered 2020-07-13 – 2020-07-14 (×2): 1 via ORAL
  Filled 2020-07-13 (×2): qty 1

## 2020-07-13 MED ORDER — OXYCODONE HCL 5 MG PO TABS: 5.0000 mg | ORAL_TABLET | Freq: Three times a day (TID) | ORAL | Status: DC | PRN

## 2020-07-13 MED ORDER — B COMPLEX VITAMINS PO CAPS: 1.0000 | ORAL_CAPSULE | Freq: Every day | ORAL | Status: DC

## 2020-07-13 MED ORDER — OXYCODONE-ACETAMINOPHEN 10-325 MG PO TABS: 1.0000 | ORAL_TABLET | Freq: Every day | ORAL | Status: DC | PRN

## 2020-07-13 MED ORDER — ELETRIPTAN HYDROBROMIDE 20 MG PO TABS: 40.0000 mg | ORAL_TABLET | Freq: Two times a day (BID) | ORAL | Status: DC | PRN

## 2020-07-13 MED ORDER — POTASSIUM PHOSPHATES 15 MMOLE/5ML IV SOLN
20.0000 mmol | Freq: Once | INTRAVENOUS | Status: DC
  Filled 2020-07-13 (×2): qty 6.67

## 2020-07-13 MED ORDER — OXYCODONE-ACETAMINOPHEN 10-325 MG PO TABS: 1.0000 | ORAL_TABLET | Freq: Three times a day (TID) | ORAL | Status: DC | PRN

## 2020-07-13 MED ORDER — ALENDRONATE SODIUM 70 MG PO TABS: 70.0000 mg | ORAL_TABLET | ORAL | Status: DC

## 2020-07-13 MED ORDER — ALBUTEROL SULFATE HFA 108 (90 BASE) MCG/ACT IN AERS
2.0000 | INHALATION_SPRAY | RESPIRATORY_TRACT | Status: DC | PRN
  Filled 2020-07-13: qty 6.7

## 2020-07-13 MED ORDER — DULOXETINE HCL 30 MG PO CPEP
30.0000 mg | ORAL_CAPSULE | Freq: Two times a day (BID) | ORAL | Status: DC
  Administered 2020-07-13 – 2020-07-14 (×3): 30 mg via ORAL
  Filled 2020-07-13 (×3): qty 1

## 2020-07-13 MED ORDER — BIOTIN 10 MG PO TABS: 10.0000 mg | ORAL_TABLET | Freq: Every day | ORAL | Status: DC

## 2020-07-13 NOTE — Progress Notes (Signed)
Progress Note  Patient Name: Helen Mitchell Date of Encounter: 07/13/2020  Primary Cardiologist:   No primary care provider on file.   Subjective   Very confused.  Complains of back pain.    Inpatient Medications    Scheduled Meds: . aspirin EC  81 mg Oral Daily  . atorvastatin  10 mg Oral Daily  . carvedilol  12.5 mg Oral BID WC  . folic acid  1 mg Oral Daily  . hydrALAZINE  10 mg Oral Q8H  . losartan  25 mg Oral Daily  . multivitamin with minerals  1 tablet Oral Daily  . thiamine  100 mg Oral Daily   Or  . thiamine  100 mg Intravenous Daily   Continuous Infusions: . heparin 950 Units/hr (07/12/20 1943)   PRN Meds: acetaminophen **OR** acetaminophen, LORazepam **OR** LORazepam, morphine injection, nitroGLYCERIN   Vital Signs    Vitals:   07/12/20 1938 07/12/20 2337 07/13/20 0350 07/13/20 0357  BP: 127/89 112/82  (!) 137/97  Pulse: (!) 104 (!) 104  (!) 107  Resp: 18 20  20   Temp: 98.2 F (36.8 C) 97.8 F (36.6 C)  97.9 F (36.6 C)  TempSrc: Oral Oral  Oral  SpO2: 94% 99%  95%  Weight:   73.9 kg   Height:        Intake/Output Summary (Last 24 hours) at 07/13/2020 0820 Last data filed at 07/13/2020 0804 Gross per 24 hour  Intake 175.51 ml  Output 550 ml  Net -374.49 ml   Filed Weights   07/12/20 0047 07/13/20 0350  Weight: 74.1 kg 73.9 kg    Telemetry    NSR - Personally Reviewed  ECG    NA - Personally Reviewed  Physical Exam   GEN: No acute distress.   Neck: No  JVD Cardiac: RRR, no murmurs, rubs, or gallops.  Respiratory: Clear  to auscultation bilaterally. GI: Soft, nontender, non-distended  MS: No  edema; No deformity. Neuro:  Nonfocal  Psych:     Very confused.   Labs    Chemistry Recent Labs  Lab 07/12/20 0309  NA 139  K 3.7  CL 108  CO2 22  GLUCOSE 151*  BUN 20  CREATININE 0.63  CALCIUM 8.6*  PROT 6.4*  ALBUMIN 3.0*  AST 36  ALT 25  ALKPHOS 68  BILITOT 1.7*  GFRNONAA >60  ANIONGAP 9     Hematology Recent Labs   Lab 07/12/20 0309 07/12/20 2308  WBC 20.3* 16.9*  RBC 4.26 4.18  HGB 13.7 13.5  HCT 42.5 41.6  MCV 99.8 99.5  MCH 32.2 32.3  MCHC 32.2 32.5  RDW 13.2 13.2  PLT 268 275    Cardiac EnzymesNo results for input(s): TROPONINI in the last 168 hours. No results for input(s): TROPIPOC in the last 168 hours.   BNPNo results for input(s): BNP, PROBNP in the last 168 hours.   DDimer No results for input(s): DDIMER in the last 168 hours.   Radiology    DG CHEST PORT 1 VIEW  Result Date: 07/12/2020 CLINICAL DATA:  Chest pain EXAM: PORTABLE CHEST 1 VIEW COMPARISON:  07/10/2020 FINDINGS: Cardiac shadow is mildly enlarged. The lungs are well aerated bilaterally. No focal infiltrate or sizable effusion is seen. No pneumothorax is noted. Old rib fractures are noted. No acute abnormality seen. IMPRESSION: No acute abnormality noted. Electronically Signed   By: 07/12/2020 M.D.   On: 07/12/2020 01:40   EEG adult  Result Date: 07/12/2020 07/14/2020, MD  07/12/2020  7:13 PM HISTORY The patient is a 65 year old woman with a history of hypertension, chronic pain, polysubstance abuse, who is being evaluated for seizure-like episodes.  TECHNICAL This study was performed according to the 10-20 International System of Electrode Placement. This record was reviewed under the following settings: bandpass filters of 1-70 Hz, sensitivity of 7 uV/mm, a display speed of 30 mm/sec, with a 60 Hz notched filter applied as appropriate.  MEDICATIONS Keppra  FINDINGS This study was recorded in the awake state only. A posterior dominant background rhythm of 9-10 Hz is seen at times. Continuous frontotemporal muscle artifact and movement artifact obscures underlying activity throughout the entirety of the study.  HYPERVENTILATION: Not performed PHOTIC STIMULATION: Not performed  IMPRESSION This is an essentially nondiagnostic study recorded in the awake state only. Continuous frontotemporal muscle artifact and movement  artifact obscures underlying cerebral activity. Consider repeating study when patient is able to cooperate.    ECHOCARDIOGRAM COMPLETE  Result Date: 07/12/2020    ECHOCARDIOGRAM REPORT   Patient Name:   [facility] Date of Exam: 07/12/2020 Medical Rec #:  [id number]    Height:       64.0 in Accession #:    [id number]   Weight:       163.4 lb Date of Birth:  [birth year]    BSA:          1.795 m Patient Age:    64 years     BP:           149/106 mmHg Patient Gender: F            HR:           119 bpm. Exam Location:  Inpatient Procedure: 2D Echo, Intracardiac Opacification Agent, Cardiac Doppler and Color            Doppler Indications:    Chest pain  History:        Patient has no prior history of Echocardiogram examinations.                 Risk Factors:Hypertension.  Sonographer:    Neomia Dear RDCS Referring Phys: 64 Eduard Clos  Sonographer Comments: Patient was agitated at time of study. IMPRESSIONS  1. LV thrombus excluded by contrast, but there is sluggish flow in the apical portion of the LV. Left ventricular ejection fraction, by estimation, is 30 to 35%. The left ventricle has moderately decreased function. The left ventricle demonstrates regional wall motion abnormalities (see scoring diagram/findings for description). The left ventricular internal cavity size was mildly dilated. Left ventricular diastolic parameters are indeterminate.  2. Right ventricular systolic function is normal. The right ventricular size is normal.  3. The mitral valve is grossly normal. Trivial mitral valve regurgitation.  4. The aortic valve is grossly normal. Aortic valve regurgitation is not visualized. Comparison(s): No prior Echocardiogram. Conclusion(s)/Recommendation(s): There is severely reduced LV function with focal wall motion abnormalities. The apical portion of all walls is akinetic, and the mid to distal lateral, anterior, and septal walls are also akinetic. Concern would be for dominant LAD; asymmetry  not typical for takotsubo cardiomyopathy but not excluded. Dr. Delton See aware of findings. FINDINGS  Left Ventricle: LV thrombus excluded by contrast, but there is sluggish flow in the apical portion of the LV. Left ventricular ejection fraction, by estimation, is 30 to 35%. The left ventricle has moderately decreased function. The left ventricle demonstrates regional wall motion abnormalities. Definity contrast agent was given IV to delineate  the left ventricular endocardial borders. The left ventricular internal cavity size was mildly dilated. There is borderline left ventricular hypertrophy. Left ventricular diastolic parameters are indeterminate.  LV Wall Scoring: The mid and distal anterior wall, mid and distal anterior septum, apical lateral segment, mid anterolateral segment, mid inferoseptal segment, and apex are akinetic. The entire inferior wall, posterior wall, basal anteroseptal segment, basal anterolateral segment, basal anterior segment, and basal inferoseptal segment are hypokinetic. Right Ventricle: The right ventricular size is normal. No increase in right ventricular wall thickness. Right ventricular systolic function is normal. Left Atrium: Left atrial size was not well visualized. Right Atrium: Right atrial size was not well visualized. Pericardium: There is no evidence of pericardial effusion. Mitral Valve: The mitral valve is grossly normal. Trivial mitral valve regurgitation. Tricuspid Valve: The tricuspid valve is grossly normal. Tricuspid valve regurgitation is trivial. Aortic Valve: The aortic valve is grossly normal. Aortic valve regurgitation is not visualized. Aortic valve mean gradient measures 3.0 mmHg. Aortic valve peak gradient measures 5.5 mmHg. Aortic valve area, by VTI measures 3.12 cm. Pulmonic Valve: The pulmonic valve was not well visualized. Pulmonic valve regurgitation is not visualized. Aorta: The aortic root, ascending aorta and aortic arch are all structurally normal, with  no evidence of dilitation or obstruction. Venous: The inferior vena cava was not well visualized. IAS/Shunts: The interatrial septum was not well visualized.  LEFT VENTRICLE PLAX 2D LVIDd:         5.00 cm     Diastology LVIDs:         2.60 cm     LV e' medial:    5.98 cm/s LV PW:         1.20 cm     LV E/e' medial:  17.2 LV IVS:        1.10 cm     LV e' lateral:   10.30 cm/s LVOT diam:     2.40 cm     LV E/e' lateral: 10.0 LV SV:         62 LV SV Index:   35 LVOT Area:     4.52 cm  LV Volumes (MOD) LV vol d, MOD A2C: 53.0 ml LV vol d, MOD A4C: 92.4 ml LV vol s, MOD A2C: 41.0 ml LV vol s, MOD A4C: 59.4 ml LV SV MOD A2C:     12.0 ml LV SV MOD A4C:     92.4 ml LV SV MOD BP:      25.2 ml RIGHT VENTRICLE RV S prime:     13.40 cm/s TAPSE (M-mode): 1.0 cm LEFT ATRIUM             Index LA diam:        3.50 cm 1.95 cm/m LA Vol (A2C):   49.7 ml 27.69 ml/m LA Vol (A4C):   26.6 ml 14.82 ml/m LA Biplane Vol: 36.3 ml 20.22 ml/m  AORTIC VALVE AV Area (Vmax):    3.55 cm AV Area (Vmean):   3.38 cm AV Area (VTI):     3.12 cm AV Vmax:           117.00 cm/s AV Vmean:          80.500 cm/s AV VTI:            0.200 m AV Peak Grad:      5.5 mmHg AV Mean Grad:      3.0 mmHg LVOT Vmax:         91.70 cm/s LVOT Vmean:  60.200 cm/s LVOT VTI:          0.138 m LVOT/AV VTI ratio: 0.69  AORTA Ao Root diam: 3.60 cm Ao Asc diam:  2.10 cm MITRAL VALVE MV Area (PHT): 8.43 cm      SHUNTS MV Decel Time: 90 msec       Systemic VTI:  0.14 m MR Peak grad:    55.7 mmHg   Systemic Diam: 2.40 cm MR Vmax:         373.00 cm/s MR PISA:         0.57 cm MR PISA Eff ROA: 4 mm MR PISA Radius:  0.30 cm MV E velocity: 103.00 cm/s MV A velocity: 93.40 cm/s MV E/A ratio:  1.10 Jodelle RedBridgette Christopher MD Electronically signed by Jodelle RedBridgette Christopher MD Signature Date/Time: 07/12/2020/1:12:30 PM    Final     Cardiac Studies   ECHO:   1. LV thrombus excluded by contrast, but there is sluggish flow in the  apical portion of the LV. Left ventricular  ejection fraction, by  estimation, is 30 to 35%. The left ventricle has moderately decreased  function. The left ventricle demonstrates  regional wall motion abnormalities (see scoring diagram/findings for  description). The left ventricular internal cavity size was mildly  dilated. Left ventricular diastolic parameters are indeterminate.  2. Right ventricular systolic function is normal. The right ventricular  size is normal.  3. The mitral valve is grossly normal. Trivial mitral valve  regurgitation.  4. The aortic valve is grossly normal. Aortic valve regurgitation is not  visualized.   Patient Profile     65 y.o. female with PMH of HTN, HLD, ETOH abuse, polysubstance abuse, and frequent falls, who [facility] of elevated troponins after presenting post-fall from bed with reported generalized shaking and AMS.    Assessment & Plan    Elevated troponin in patient with no known CAD history:    Deferring any invasive work up with comorbid conditions.  Continue heparin x 24 hours total.   Reduced EF as above.  Plan will be pending her baseline AMS.     Prolonged QTc:   No EKG today.  Would repeat in the AM.  Ordered.   Trying to keep potassium and Mg above 4 and 2 respectively.  Labs pending.    HTN:   This is being managed in the context of treating his CHF   HLD:   Lipid panel results pending.    Leukocytosis: WBC down today.   Cardiomyopathy:   On beta blocker.  ARB started.  BP is dipping down but she might be [facility].  No change at present.    For questions or updates, please contact [facility] Please consult [email or web address] for contact info under Cardiology/STEMI.   Signed, Rollene RotundaJames Mayari Matus, MD  07/13/2020, 8:20 AM

## 2020-07-13 NOTE — Progress Notes (Signed)
EEG complete - results pending 

## 2020-07-13 NOTE — Progress Notes (Signed)
ANTICOAGULATION CONSULT NOTE - Follow Up Consult  Pharmacy Consult for heparin Indication: ACS  Labs: Recent Labs    07/12/20 0309 07/12/20 1037 07/12/20 2308 07/13/20 0700 07/13/20 0916 07/13/20 1545 07/13/20 2139  HGB 13.7  --  13.5  --   --   --   --   HCT 42.5  --  41.6  --   --   --   --   PLT 268  --  275  --   --   --   --   HEPARINUNFRC 0.44   < > 0.21* 0.25*  --  0.35 0.27*  CREATININE 0.63  --   --   --  0.70  --   --   TROPONINIHS 338*  --   --   --   --   --   --    < > = values in this interval not displayed.    Assessment: 64yo female subtherapeutic on heparin after one level at low end of goal; no gtt issues or signs of bleeding per RN.  Goal of Therapy:  Heparin level 0.3-0.7 units/ml   Plan:  Will increase heparin gtt by 2 units/kg/hr to 1200 units/hr until off at 0200.    Vernard Gambles, PharmD, BCPS  07/13/2020,11:32 PM

## 2020-07-13 NOTE — Progress Notes (Addendum)
ANTICOAGULATION CONSULT NOTE - Follow Up Consult  Pharmacy Consult for Heparin Indication: chest pain/ACS  No Known Allergies  Patient Measurements: Height: 5\' 4"  (162.6 cm) Weight: 73.9 kg (162 lb 14.7 oz) IBW/kg (Calculated) : 54.7 Heparin Dosing Weight: 70 kg  Vital Signs: BP: 143/89 (04/01 1240) Pulse Rate: 99 (04/01 1240)  Labs: Recent Labs    07/12/20 0309 07/12/20 1037 07/12/20 2308 07/13/20 0700 07/13/20 0916 07/13/20 1545  HGB 13.7  --  13.5  --   --   --   HCT 42.5  --  41.6  --   --   --   PLT 268  --  275  --   --   --   HEPARINUNFRC 0.44   < > 0.21* 0.25*  --  0.35  CREATININE 0.63  --   --   --  0.70  --   TROPONINIHS 338*  --   --   --   --   --    < > = values in this interval not displayed.    Estimated Creatinine Clearance: 68.1 mL/min (by C-G formula based on SCr of 0.7 mg/dL).   Assessment: 65yo female transferred to [facility] for further cardiac w/u, started on heparin by OSH for CP, which was continued on transfer.   Heparin level 0.35 on heparin 1050 units/hr. Per RN patient did not pull out IV today. No noted bleeding.   Goal of Therapy:  Heparin level 0.3-0.7 units/ml Monitor platelets by anticoagulation protocol: Yes   Plan:  Continue heparin 1050 units/hr Check 6 hr heparin level  Monitor heparin level, CBC and s/s of bleeding daily  D[facility]ed with [facility] and will stop heparin at 0200 on 4/2 (48 hr duration). Per Dr. Antoine Poche okay to stop heparin early if access becomes an issue.   Antoine Poche, PharmD Clinical Pharmacist  07/13/2020,4:35 PM

## 2020-07-13 NOTE — Progress Notes (Signed)
ANTICOAGULATION CONSULT NOTE - Follow Up Consult  Pharmacy Consult for Heparin Indication: chest pain/ACS  No Known Allergies  Patient Measurements: Height: 5\' 4"  (162.6 cm) Weight: 73.9 kg (162 lb 14.7 oz) IBW/kg (Calculated) : 54.7 Heparin Dosing Weight: 70 kg  Vital Signs: Temp: 97.9 F (36.6 C) (04/01 0357) Temp Source: Oral (04/01 0357) BP: 137/97 (04/01 0357) Pulse Rate: 107 (04/01 0357)  Labs: Recent Labs    07/12/20 0309 07/12/20 1037 07/12/20 2308 07/13/20 0700  HGB 13.7  --  13.5  --   HCT 42.5  --  41.6  --   PLT 268  --  275  --   HEPARINUNFRC 0.44 0.31 0.21* 0.25*  CREATININE 0.63  --   --   --   TROPONINIHS 338*  --   --   --     Estimated Creatinine Clearance: 68.1 mL/min (by C-G formula based on SCr of 0.63 mg/dL).   Assessment: 65yo female transferred to [facility] for further cardiac w/u, started on heparin by OSH for CP, which was continued on transfer.   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 IV was restarted and continued heparin drip t 950 units/hr.   This AM heparin level increased some to  0.25,  Subtherapeutic. CBC within normal. No bleeding noted.  Goal of Therapy:  Heparin level 0.3-0.7 units/ml Monitor platelets by anticoagulation protocol: Yes   Plan:  Increase  heparin gtt to 1050 units/hr Will f/u 6 hr heparin level [facility] HL and CBC  Antony Madura, Noah Delaine Clinical Pharmacist [phone] or Please see amion for complete clinical pharmacist phone list 07/13/2020,8:03 AM

## 2020-07-13 NOTE — Progress Notes (Signed)
PROGRESS NOTE    Helen SilvasMary Mitchell  ZHY:[id number] DOB: 05-10-1955 DOA: 07/12/2020 PCP: Pcp, No    Brief Narrative:  65 year old female with history of hypertension, chronic pain syndrome on chronic pain management with Percocet, previously on Xanax that was stopped about a month ago and is still intermittently taking, on  SSRI had witnessed seizure-like activity at home and 1 more similar episode [facility].  Patient was confused.  She only tells us that she fell off the bed.  On initial presentation to emergency room, patient was found to have minimally elevated troponins, started on heparin drip and [facility].  Patient did have multiple falls, recent about a month ago resulting in vertebral fractures.  On more pain management since then.  Patient denies any use of phenobarbital, street drugs or alcohol.  A corroborative history from patient's caretaker Mr. Lyda JesterCurtis and patient's daughter Ms. Delorise Jacksonori reveals that patient has heavy alcohol problem, she has episodes of withdrawals in the past and hospitalizations. Remains in the hospital with confusion and disorientation.   Assessment & Plan:   Principal Problem:   NSTEMI (non-ST elevated myocardial infarction) (HCC) Active Problems:   Elevated troponin   Essential hypertension   Non-STEMI (non-ST elevated myocardial infarction) (HCC)   Alcohol withdrawal syndrome, with delirium (HCC)   Prolonged QT interval  Non-ST elevation MI: Decreased T wave changes on presentation.  No history of cardiac stent.  Currently chest pain-free.  Minimally elevated troponins thought to be demand ischemia.  Seen by cardiology.  Due to disorientation, currently conservative management advised. Patient on aspirin, statin and beta-blockers. Heparin infusion for 48 hours. Echocardiogram with ejection fraction 35%, euvolemic.  Will need ischemic evaluation after clinical improvement as per cardiology.  Altered mental status/acute metabolic  encephalopathy/postictal or alcohol withdrawal.  Also suspect other drug withdrawals. Patient with florid alcohol/benzodiazepine withdrawal today. Started on high-dose benzodiazepines withdrawal protocol.  All-time fall precautions.  Delirium precautions. Seizure precautions.  Safety precautions and one-to-one monitoring if available.  Can use soft restraints to protect from fall and injury and protect lines. High-dose thiamine 500 mg 3 times a day for 5 days due to history of heavy alcoholism. Repeat EEGs were without evidence of seizure. Not on any antiseizure medication at this time as seizures are likely due to withdrawals. MRI pending, unable to do due to restlessness.  We will try today with injection Ativan, if unable will do CT head. Appreciate neurology input.  Chronic pain syndrome: Patient on chronic opiates.  Will resume patient on half dose of oxycodone to prevent from opiate withdrawals.  She has pain management.  Prolonged QT: Aggressive replace electrolytes.  Replace phosphorus and potassium further today.  Recheck tomorrow morning.  Recheck EKG.  Anxiety/depression: Patient on Cymbalta, resume on half the dose.  Resume Percocet on half doses.   DVT prophylaxis: Heparin infusion.   Code Status: Full code Family Communication: Patient's daughter on the phone Disposition Plan: Status is: Inpatient  Remains inpatient appropriate because:Inpatient level of care appropriate due to severity of illness   Dispo: The patient is from: Home              Anticipated d/c is to: Home              Patient currently is not medically stable to d/c.   Difficult to place patient No         Consultants:   Cardiology  Neurology  Procedures:   EEG  Antimicrobials:   None  Subjective: Patient was seen and examined.  Overnight events noted.  Patient remained confused and agitated at night. Today on my interview, patient is less interactive, she keeps her eyes closed.   She is tremulous and shaky.  She is confabulating.  Not able to engage in conversation.  Objective: Vitals:   07/12/20 1938 07/12/20 2337 07/13/20 0350 07/13/20 0357  BP: 127/89 112/82  (!) 137/97  Pulse: (!) 104 (!) 104  (!) 107  Resp: 18 20  20   Temp: 98.2 F (36.8 C) 97.8 F (36.6 C)  97.9 F (36.6 C)  TempSrc: Oral Oral  Oral  SpO2: 94% 99%  95%  Weight:   73.9 kg   Height:        Intake/Output Summary (Last 24 hours) at 07/13/2020 1130 Last data filed at 07/13/2020 0804 Gross per 24 hour  Intake 100 ml  Output 550 ml  Net -450 ml   Filed Weights   07/12/20 0047 07/13/20 0350  Weight: 74.1 kg 73.9 kg    Examination:  General exam: Appears sick, tremulous and shaky. Alert on stimulation, easily distractible, confabulating and some incomprehensible words. Respiratory system: Clear to auscultation. Respiratory effort normal.  No added sounds. Cardiovascular system: S1 & S2 heard, RRR.  No added sounds. Gastrointestinal system: Abdomen is nondistended, soft and nontender. No organomegaly or masses felt. Normal bowel sounds heard. No focal deficits.  Data Reviewed: I have personally reviewed following labs and imaging studies  CBC: Recent Labs  Lab 07/12/20 0309 07/12/20 2308  WBC 20.3* 16.9*  NEUTROABS 15.2*  --   HGB 13.7 13.5  HCT 42.5 41.6  MCV 99.8 99.5  PLT 268 275   Basic Metabolic Panel: Recent Labs  Lab 07/12/20 0309 07/12/20 2308 07/13/20 0916  NA 139  --  139  K 3.7  --  3.6  CL 108  --  106  CO2 22  --  22  GLUCOSE 151*  --  132*  BUN 20  --  22  CREATININE 0.63  --  0.70  CALCIUM 8.6*  --  9.0  MG  --  3.2* 2.5*  PHOS  --  2.2* 2.2*   GFR: Estimated Creatinine Clearance: 68.1 mL/min (by C-G formula based on SCr of 0.7 mg/dL). Liver Function Tests: Recent Labs  Lab 07/12/20 0309 07/13/20 0916  AST 36 54*  ALT 25 33  ALKPHOS 68 69  BILITOT 1.7* 1.6*  PROT 6.4* 6.5  ALBUMIN 3.0* 3.1*   No results for input(s): LIPASE, AMYLASE in  the last 168 hours. Recent Labs  Lab 07/12/20 2308  AMMONIA 26   Coagulation Profile: No results for input(s): INR, PROTIME in the last 168 hours. Cardiac Enzymes: No results for input(s): CKTOTAL, CKMB, CKMBINDEX, TROPONINI in the last 168 hours. BNP (last 3 results) No results for input(s): PROBNP in the last 8760 hours. HbA1C: Recent Labs    07/12/20 1037  HGBA1C 6.4*   CBG: No results for input(s): GLUCAP in the last 168 hours. Lipid Profile: Recent Labs    07/12/20 1037  CHOL 154  HDL 48  LDLCALC 82  TRIG 120  CHOLHDL 3.2   Thyroid Function Tests: Recent Labs    07/12/20 0309  TSH 0.416   Anemia Panel: Recent Labs    07/12/20 2308  VITAMINB12 395   Sepsis Labs: No results for input(s): PROCALCITON, LATICACIDVEN in the last 168 hours.  Recent Results (from the past 240 hour(s))  SARS CORONAVIRUS 2 (TAT 6-24 HRS) Nasopharyngeal Nasopharyngeal Swab  Status: None   Collection Time: 07/12/20  3:13 AM   Specimen: Nasopharyngeal Swab  Result Value Ref Range Status   SARS Coronavirus 2 NEGATIVE NEGATIVE Final    Comment: (NOTE) SARS-CoV-2 target nucleic acids are NOT DETECTED.  The SARS-CoV-2 RNA is generally detectable in upper and lower respiratory specimens during the acute phase of infection. Negative results do not preclude SARS-CoV-2 infection, do not rule out co-infections with other pathogens, and should not be used as the sole basis for treatment or other patient management decisions. Negative results must be combined with clinical observations, patient history, and epidemiological information. The expected result is Negative.  Fact Sheet for Patients: HairSlick.no  Fact Sheet for Healthcare Providers: quierodirigir.com  This test is not yet approved or cleared by the [place] FDA and  has been authorized for detection and/or diagnosis of SARS-CoV-2 by FDA under an Emergency Use  Authorization (EUA). This EUA will remain  in effect (meaning this test can be used) for the duration of the COVID-19 declaration under Se ction 564(b)(1) of the Act, 21 U.S.C. section 360bbb-3(b)(1), unless the authorization is terminated or revoked sooner.  Performed at [facility], [address] [place], [place] [address]          Radiology Studies: DG CHEST PORT 1 VIEW  Result Date: 07/12/2020 CLINICAL DATA:  Chest pain EXAM: PORTABLE CHEST 1 VIEW COMPARISON:  07/10/2020 FINDINGS: Cardiac shadow is mildly enlarged. The lungs are well aerated bilaterally. No focal infiltrate or sizable effusion is seen. No pneumothorax is noted. Old rib fractures are noted. No acute abnormality seen. IMPRESSION: No acute abnormality noted. Electronically Signed   By: Alcide Clever M.D.   On: 07/12/2020 01:40   EEG adult  Result Date: 07/12/2020 Rosanne Ashing, MD     07/12/2020  7:13 PM HISTORY The patient is a 65 year old woman with a history of hypertension, chronic pain, polysubstance abuse, who is being evaluated for seizure-like episodes.  TECHNICAL This study was performed according to the 10-20 International System of Electrode Placement. This record was reviewed under the following settings: bandpass filters of 1-70 Hz, sensitivity of 7 uV/mm, a display speed of 30 mm/sec, with a 60 Hz notched filter applied as appropriate.  MEDICATIONS Keppra  FINDINGS This study was recorded in the awake state only. A posterior dominant background rhythm of 9-10 Hz is seen at times. Continuous frontotemporal muscle artifact and movement artifact obscures underlying activity throughout the entirety of the study.  HYPERVENTILATION: Not performed PHOTIC STIMULATION: Not performed  IMPRESSION This is an essentially nondiagnostic study recorded in the awake state only. Continuous frontotemporal muscle artifact and movement artifact obscures underlying cerebral activity. Consider repeating study when patient is able  to cooperate.    ECHOCARDIOGRAM COMPLETE  Result Date: 07/12/2020    ECHOCARDIOGRAM REPORT   Patient Name:   [facility] L[facility]t Date of Exam: 07/12/2020 Medical Rec #:  [id number]    Height:       64.0 in Accession #:    [id number]   Weight:       163.4 lb Date of Birth:  1956/01/20    BSA:          1.795 m Patient Age:    64 years     BP:           149/106 mmHg Patient Gender: F            HR:           119  bpm. Exam Location:  Inpatient Procedure: 2D Echo, Intracardiac Opacification Agent, Cardiac Doppler and Color            Doppler Indications:    Chest pain  History:        Patient has no prior history of Echocardiogram examinations.                 Risk Factors:Hypertension.  Sonographer:    Neomia Dear RDCS Referring Phys: 83 Eduard Clos  Sonographer Comments: Patient was agitated at time of study. IMPRESSIONS  1. LV thrombus excluded by contrast, but there is sluggish flow in the apical portion of the LV. Left ventricular ejection fraction, by estimation, is 30 to 35%. The left ventricle has moderately decreased function. The left ventricle demonstrates regional wall motion abnormalities (see scoring diagram/findings for description). The left ventricular internal cavity size was mildly dilated. Left ventricular diastolic parameters are indeterminate.  2. Right ventricular systolic function is normal. The right ventricular size is normal.  3. The mitral valve is grossly normal. Trivial mitral valve regurgitation.  4. The aortic valve is grossly normal. Aortic valve regurgitation is not visualized. Comparison(s): No prior Echocardiogram. Conclusion(s)/Recommendation(s): There is severely reduced LV function with focal wall motion abnormalities. The apical portion of all walls is akinetic, and the mid to distal lateral, anterior, and septal walls are also akinetic. Concern would be for dominant LAD; asymmetry not typical for takotsubo cardiomyopathy but not excluded. Dr. Delton See aware of findings.  FINDINGS  Left Ventricle: LV thrombus excluded by contrast, but there is sluggish flow in the apical portion of the LV. Left ventricular ejection fraction, by estimation, is 30 to 35%. The left ventricle has moderately decreased function. The left ventricle demonstrates regional wall motion abnormalities. Definity contrast agent was given IV to delineate the left ventricular endocardial borders. The left ventricular internal cavity size was mildly dilated. There is borderline left ventricular hypertrophy. Left ventricular diastolic parameters are indeterminate.  LV Wall Scoring: The mid and distal anterior wall, mid and distal anterior septum, apical lateral segment, mid anterolateral segment, mid inferoseptal segment, and apex are akinetic. The entire inferior wall, posterior wall, basal anteroseptal segment, basal anterolateral segment, basal anterior segment, and basal inferoseptal segment are hypokinetic. Right Ventricle: The right ventricular size is normal. No increase in right ventricular wall thickness. Right ventricular systolic function is normal. Left Atrium: Left atrial size was not well visualized. Right Atrium: Right atrial size was not well visualized. Pericardium: There is no evidence of pericardial effusion. Mitral Valve: The mitral valve is grossly normal. Trivial mitral valve regurgitation. Tricuspid Valve: The tricuspid valve is grossly normal. Tricuspid valve regurgitation is trivial. Aortic Valve: The aortic valve is grossly normal. Aortic valve regurgitation is not visualized. Aortic valve mean gradient measures 3.0 mmHg. Aortic valve peak gradient measures 5.5 mmHg. Aortic valve area, by VTI measures 3.12 cm. Pulmonic Valve: The pulmonic valve was not well visualized. Pulmonic valve regurgitation is not visualized. Aorta: The aortic root, ascending aorta and aortic arch are all structurally normal, with no evidence of dilitation or obstruction. Venous: The inferior vena cava was not well  visualized. IAS/Shunts: The interatrial septum was not well visualized.  LEFT VENTRICLE PLAX 2D LVIDd:         5.00 cm     Diastology LVIDs:         2.60 cm     LV e' medial:    5.98 cm/s LV PW:         1.20  cm     LV E/e' medial:  17.2 LV IVS:        1.10 cm     LV e' lateral:   10.30 cm/s LVOT diam:     2.40 cm     LV E/e' lateral: 10.0 LV SV:         62 LV SV Index:   35 LVOT Area:     4.52 cm  LV Volumes (MOD) LV vol d, MOD A2C: 53.0 ml LV vol d, MOD A4C: 92.4 ml LV vol s, MOD A2C: 41.0 ml LV vol s, MOD A4C: 59.4 ml LV SV MOD A2C:     12.0 ml LV SV MOD A4C:     92.4 ml LV SV MOD BP:      25.2 ml RIGHT VENTRICLE RV S prime:     13.40 cm/s TAPSE (M-mode): 1.0 cm LEFT ATRIUM             Index LA diam:        3.50 cm 1.95 cm/m LA Vol (A2C):   49.7 ml 27.69 ml/m LA Vol (A4C):   26.6 ml 14.82 ml/m LA Biplane Vol: 36.3 ml 20.22 ml/m  AORTIC VALVE AV Area (Vmax):    3.55 cm AV Area (Vmean):   3.38 cm AV Area (VTI):     3.12 cm AV Vmax:           117.00 cm/s AV Vmean:          80.500 cm/s AV VTI:            0.200 m AV Peak Grad:      5.5 mmHg AV Mean Grad:      3.0 mmHg LVOT Vmax:         91.70 cm/s LVOT Vmean:        60.200 cm/s LVOT VTI:          0.138 m LVOT/AV VTI ratio: 0.69  AORTA Ao Root diam: 3.60 cm Ao Asc diam:  2.10 cm MITRAL VALVE MV Area (PHT): 8.43 cm      SHUNTS MV Decel Time: 90 msec       Systemic VTI:  0.14 m MR Peak grad:    55.7 mmHg   Systemic Diam: 2.40 cm MR Vmax:         373.00 cm/s MR PISA:         0.57 cm MR PISA Eff ROA: 4 mm MR PISA Radius:  0.30 cm MV E velocity: 103.00 cm/s MV A velocity: 93.40 cm/s MV E/A ratio:  1.10 Jodelle Red MD Electronically signed by Jodelle Red MD Signature Date/Time: 07/12/2020/1:12:30 PM    Final         Scheduled Meds: . aspirin EC  81 mg Oral Daily  . atorvastatin  10 mg Oral Daily  . carvedilol  12.5 mg Oral BID WC  . DULoxetine  30 mg Oral BID  . folic acid  1 mg Oral Daily  . hydrALAZINE  10 mg Oral Q8H  . lactulose   10 g Oral BID  . losartan  25 mg Oral Daily  . multivitamin with minerals  1 tablet Oral Daily  . potassium chloride  40 mEq Oral Daily  . Vitamin D  5,000 Units Oral Daily   Continuous Infusions: . heparin 1,050 Units/hr (07/13/20 0907)  . potassium PHOSPHATE IVPB (in mmol)    . thiamine injection       LOS: 1 day    Time spent: 35  minutes    Barb Merino, MD [facility] Pager [phone]

## 2020-07-14 ENCOUNTER — Inpatient Hospital Stay (HOSPITAL_COMMUNITY): Payer: Medicare Other

## 2020-07-14 LAB — COMPREHENSIVE METABOLIC PANEL
ALT: 34 U/L (ref 0–44)
AST: 45 U/L — ABNORMAL HIGH (ref 15–41)
Albumin: 3 g/dL — ABNORMAL LOW (ref 3.5–5.0)
Alkaline Phosphatase: 65 U/L (ref 38–126)
Anion gap: 11 (ref 5–15)
BUN: 19 mg/dL (ref 8–23)
CO2: 22 mmol/L (ref 22–32)
Calcium: 8.9 mg/dL (ref 8.9–10.3)
Chloride: 104 mmol/L (ref 98–111)
Creatinine, Ser: 0.75 mg/dL (ref 0.44–1.00)
GFR, Estimated: >60 mL/min (ref >60)
Glucose, Bld: 113 mg/dL — ABNORMAL HIGH (ref 70–99)
Potassium: 3.9 mmol/L (ref 3.5–5.1)
Sodium: 137 mmol/L (ref 135–145)
Total Bilirubin: 1.4 mg/dL — ABNORMAL HIGH (ref 0.3–1.2)
Total Protein: 6.5 g/dL (ref 6.5–8.1)

## 2020-07-14 LAB — CBC
HCT: 41 % (ref 36.0–46.0)
Hemoglobin: 13.1 g/dL (ref 12.0–15.0)
MCH: 32 pg (ref 26.0–34.0)
MCHC: 32 g/dL (ref 30.0–36.0)
MCV: 100.2 fL — ABNORMAL HIGH (ref 80.0–100.0)
Platelets: 285 10*3/uL (ref 150–400)
RBC: 4.09 MIL/uL (ref 3.87–5.11)
RDW: 13.2 % (ref 11.5–15.5)
WBC: 15.1 10*3/uL — ABNORMAL HIGH (ref 4.0–10.5)
nRBC: 0.1 % (ref 0.0–0.2)

## 2020-07-14 LAB — MAGNESIUM: Magnesium: 2.1 mg/dL (ref 1.7–2.4)

## 2020-07-14 LAB — PHOSPHORUS: Phosphorus: 3 mg/dL (ref 2.5–4.6)

## 2020-07-14 MED ORDER — DULOXETINE HCL 60 MG PO CPEP
60.0000 mg | ORAL_CAPSULE | Freq: Two times a day (BID) | ORAL | Status: DC
  Administered 2020-07-14 – 2020-07-15 (×2): 60 mg via ORAL
  Filled 2020-07-14 (×2): qty 1

## 2020-07-14 MED ORDER — OXYCODONE-ACETAMINOPHEN 5-325 MG PO TABS
1.0000 | ORAL_TABLET | Freq: Four times a day (QID) | ORAL | Status: DC | PRN
  Administered 2020-07-14 – 2020-07-15 (×3): 1 via ORAL
  Filled 2020-07-14 (×3): qty 1

## 2020-07-14 MED ORDER — ENOXAPARIN SODIUM 40 MG/0.4ML ~~LOC~~ SOLN
40.0000 mg | SUBCUTANEOUS | Status: DC
  Administered 2020-07-14 – 2020-07-15 (×2): 40 mg via SUBCUTANEOUS
  Filled 2020-07-14 (×2): qty 0.4

## 2020-07-14 NOTE — Progress Notes (Signed)
Pt has brain MRI ordered, however, patient not compliant  As of now. Noted still confused actively getting out of the bed, pulling tele leads off and pIV. Sitter at bedside.  Had given ativan but still pt agitated. Will address this at AM.

## 2020-07-14 NOTE — TOC Initial Note (Signed)
Transition of Care [facility]) - Initial/Assessment Note    Patient Details  Name: Helen Mitchell MRN: [id number] Date of Birth: [birth year]  [facility]) CM/SW Contact:    Oretha Milch, LCSW Phone Number: 07/14/2020, 4:36 PM  Clinical Narrative: CSW met with patient to address consult for ETOH and substance use. Patient had visitor, Vicente Serene, present in the room during conversation who stepped out during screening. CSW inquired into patient's status and noted patient stated "I just want to go home." Patient reports she drinks 3-5 times a week about 2-4 cups of wine. Patient reports she does not intentionally mix xanax with alcohol but reports she probably does take them while drinking on occasion. CSW provided psychoeducation on the risks of mixing benzodiazepines with alcohol and discussed CNS and respiratory failure in the context of alcohol. Patient shared of a story she knew of someone who passed away taking xanax while drinking. Patient was guarded in regards to treatment but endorsed intent to cease alcohol. CSW reviewed inpatient treatment options and noted patient declined referrals but did provide verbal consent for coordination in arranging intake phone assessment with [facility] on discharge. CSW also noted consent to provide information to [facility] for additional support.                  Expected Discharge Plan: Home/Self Care Barriers to Discharge: Continued Medical Work up   Patient Goals and CMS Choice Patient states their goals for this hospitalization and ongoing recovery are:: "I just want to go home."      Expected Discharge Plan and Services Expected Discharge Plan: Home/Self Care                                              Prior Living Arrangements/Services   Lives with:: Significant Other Patient language and need for interpreter reviewed:: Yes Do you feel safe going back to the place where you live?: Yes      Need for Family Participation  in Patient Care: No (Comment) Care giver support system in place?: No (comment)   Criminal Activity/Legal Involvement Pertinent to Current Situation/Hospitalization: No - Comment as needed  Activities of Daily Living      Permission Sought/Granted Permission sought to share information with : Chartered certified accountant granted to share information with : Yes, Verbal Permission Granted              Emotional Assessment Appearance:: Appears older than stated age Attitude/Demeanor/Rapport: Apprehensive,Lethargic Affect (typically observed): Flat Orientation: : Oriented to Self,Oriented to Place,Oriented to  Time,Oriented to Situation Alcohol / Substance Use: Alcohol Use Psych Involvement: No (comment)  Admission diagnosis:  Non-STEMI (non-ST elevated myocardial infarction) [facility]) [I21.4] Patient Active Problem List   Diagnosis Date Noted  . Elevated troponin 07/12/2020  . NSTEMI (non-ST elevated myocardial infarction) ([place]) 07/12/2020  . Essential hypertension 07/12/2020  . Non-STEMI (non-ST elevated myocardial infarction) ([place]) 07/12/2020  . Prolonged QT interval 07/12/2020  . Alcohol withdrawal syndrome, with delirium [facility])    PCP:  Pcp, No Pharmacy:   [facility] [address], [place] [address] Phone: [phone] Fax: [phone]     Social Determinants of Health (SDOH) Interventions    Readmission Risk Interventions No flowsheet data found.

## 2020-07-14 NOTE — Progress Notes (Signed)
Neurology Progress Note  S: Still agitated at times. Had 2mg  Ativan on 07/13/20 and 4mg  overnight per CIWA protocol. Still unable to obtain MRI brain due to patient's agitation. No seizure like activity witnessed by 09/12/20. Patient c/o HA but received Percocet 30 mins prior to exam. She is a reluctant historian about her visit to [facility] in February. States last benzo use was in February, but UDS positive for benzos, opiates, and barbituates and she can not explain the reason for this result. States she had a small tupperware cup of alcohol 4 days ago and none since. NP finds no ETOH level on admission.   O: Current vital signs: BP (!) 129/91 (BP Location: Right Arm)   Pulse 98   Temp 98 F (36.7 C)   Resp 18   Ht 5\' 4"  (1.626 m)   Wt 73.9 kg   SpO2 94%   BMI 27.97 kg/m  Vital signs in last 24 hours: Temp:  [98 F (36.7 C)] 98 F (36.7 C) (04/01 1909) Pulse Rate:  [98-104] 98 (04/02 0927) Resp:  [18] 18 (04/02 0927) BP: (126-143)/(86-91) 129/91 (04/02 0927) SpO2:  [94 %-99 %] 94 % (04/02 0927)  GENERAL: Awake, alert in NAD HEENT: Normocephalic and atraumatic LUNGS: Normal respiratory effort.  CV: RRR.  Ext: warm Psych: calm, affect is flat.   NEURO:  Mental Status: AA&O x name, place, city and state, and year, but not day or date.  She appears to not want to participate in exam. Eyes closed except with pupil check.  Speech/Language: speech is without aphasia or dysarthria.  Naming, repetition, fluency, and comprehension intact.  Cranial Nerves:  II: PERRL. Visual fields full.  III, IV, VI: Eyelids elevate symmetrically.  V: Sensation is intact to light touch and symmetrical to face.  VII: Smile is symmetrical. VIII: hearing intact to voice. IX, X: Palate elevates symmetrically. Hypophonia.   06-28-1984 shrug 5/5. XII: tongue is midline without fasciculations. Motor: 5/5 strength to all muscle groups tested.  Tone: is normal and bulk is normal Sensation-  Intact to light touch bilaterally. Extinction absent to light touch to DSS.    Gait- deferred  Medications  Current Facility-Administered Medications:  .  acetaminophen (TYLENOL) tablet 650 mg, 650 mg, Oral, Q6H PRN, 650 mg at 07/13/20 0210 **OR** acetaminophen (TYLENOL) suppository 650 mg, 650 mg, Rectal, Q6H PRN, 06-28-1984, MD .  albuterol (VENTOLIN HFA) 108 (90 Base) MCG/ACT inhaler 2 puff, 2 puff, Inhalation, Q4H PRN, HC:WCBJSEGB, MD .  aspirin EC tablet 81 mg, 81 mg, Oral, Daily, 09/12/20, MD, 81 mg at 07/14/20 0926 .  atorvastatin (LIPITOR) tablet 10 mg, 10 mg, Oral, Daily, Dorcas Carrow, MD, 10 mg at 07/14/20 0925 .  carvedilol (COREG) tablet 12.5 mg, 12.5 mg, Oral, BID WC, 09/13/20, MD, 12.5 mg at 07/14/20 0630 .  cholecalciferol (VITAMIN D3) tablet 5,000 Units, 5,000 Units, Oral, Daily, 09/13/20, MD, 5,000 Units at 07/14/20 0925 .  DULoxetine (CYMBALTA) DR capsule 30 mg, 30 mg, Oral, BID, 09/13/20, MD, 30 mg at 07/14/20 0926 .  folic acid (FOLVITE) tablet 1 mg, 1 mg, Oral, Daily, 09/13/20, MD, 1 mg at 07/14/20 09/13/20 .  hydrALAZINE (APRESOLINE) tablet 10 mg, 10 mg, Oral, Q8H, Rejeana Brock, MD, 10 mg at 07/14/20 0545 .  lactulose (CHRONULAC) 10 GM/15ML solution 10 g, 10 g, Oral, BID, 1517, MD, 10 g at 07/14/20 0926 .  LORazepam (ATIVAN) tablet 1-4 mg,  1-4 mg, Oral, Q1H PRN, 4 mg at 07/14/20 0010 **OR** LORazepam (ATIVAN) injection 1-4 mg, 1-4 mg, Intravenous, Q1H PRN, Rejeana Brock, MD, 2 mg at 07/13/20 1108 .  losartan (COZAAR) tablet 25 mg, 25 mg, Oral, Daily, Lars Masson, MD, 25 mg at 07/14/20 0926 .  morphine 2 MG/ML injection 1 mg, 1 mg, Intravenous, Q2H PRN, Eduard Clos, MD, 1 mg at 07/12/20 1610 .  multivitamin with minerals tablet 1 tablet, 1 tablet, Oral, Daily, Rejeana Brock, MD, 1 tablet at 07/14/20 0926 .  nitroGLYCERIN (NITROSTAT) SL tablet 0.4 mg, 0.4 mg,  Sublingual, Q5 min PRN, Eduard Clos, MD .  oxyCODONE-acetaminophen (PERCOCET/ROXICET) 5-325 MG per tablet 1 tablet, 1 tablet, Oral, Q8H PRN, 1 tablet at 07/14/20 0814 **AND** oxyCODONE (Oxy IR/ROXICODONE) immediate release tablet 5 mg, 5 mg, Oral, Q8H PRN, Dorcas Carrow, MD .  oxymetazoline (AFRIN) 0.05 % nasal spray 1 spray, 1 spray, Each Nare, BID PRN, Dorcas Carrow, MD .  potassium chloride SA (KLOR-CON) CR tablet 40 mEq, 40 mEq, Oral, Daily, Dorcas Carrow, MD, 40 mEq at 07/14/20 0927 .  potassium PHOSPHATE 20 mmol in dextrose 5 % 500 mL infusion, 20 mmol, Intravenous, Once, Dorcas Carrow, MD, Held at 07/13/20 1557 .  thiamine 500mg  in normal saline (68ml) IVPB, 500 mg, Intravenous, TID, 45m, MD, Last Rate: 100 mL/hr at 07/14/20 0930, 500 mg at 07/14/20 0930  Imaging MD has reviewed images in epic and the results pertinent to this consultation a  MRI Brain was later able to be performed and results are: 1. No acute intracranial abnormality. 2. Moderate atrophy and white matter disease likely reflects the sequela of chronic microvascular ischemia. 3. Atrophy is most evident in the anterior frontal lobes Bilaterally.   Assessment: 65 yo female with history of heavy ETOH use and polysubstance abuse who presented to ED 2 days ago as transfer from [facility] after presentation there for AMS and supposed seizure activity witnessed by a friend. She was loaded with Keppra here and AEDs not continued as seizures were not suspicious for organic in origin. She has already had review of [place] laws for seizures with driving and safety precautions at home.   Plan:  1. Questionable seizure at home, none noted here. EEG artifactual, have repeated and awaiting results.  She was loaded with Keppra on admission but no AEDs prescribed further due to low suspicion for organic seizures. No AEDs unless EEG is positive for ictal episodes seen. Have patient neurology in 2-4 weeks.  2. ETOH  abuse with last use 4 days ago. Usually drinks daily, so this is likely contributing to seizures. Continue CIWA.  3. Elevated troponin on Heparin. Per cardiology.  4. Benzodiazapine use. Patient says she has not had any since February, but UDA +. Starting and stopping Benzos can increase risk of seizures. 5. Polysubstance abuse-case manager involvement. Questionable rehab, AA, etc. Although, doubt patient will agree of be adherent to plan.    Pt seen by March, MSN, APN-BC/Nurse Practitioner/Neuro and discussed with MD who agrees with plan  Pager: Jimmye Norman

## 2020-07-14 NOTE — Progress Notes (Signed)
   Dr. Antoine Poche note reviewed from 4/1  Confusion, MRI of brain currently pending.  EF 35%.  Further plan pending her baseline mental status  No change today.  Donato Schultz, MD

## 2020-07-14 NOTE — Progress Notes (Signed)
PROGRESS NOTE    Helen Mitchell  IHK:[id number] DOB: 07-Jul-1955 DOA: 07/12/2020 PCP: Pcp, No    Brief Narrative:  65 year old female with history of hypertension, chronic pain syndrome on chronic pain management with Percocet, previously on Xanax that was stopped about a month ago and is still intermittently taking, on  SSRI had witnessed seizure-like activity at home and 1 more similar episode at [facility].  Patient was confused.  She only tells [place] that she fell off the bed.  On initial presentation to emergency room, patient was found to have minimally elevated troponins, started on heparin drip and transferred to [facility].  Patient did have multiple falls, recent about a month ago resulting in vertebral fractures.  On more pain management since then.  Patient denies any use of phenobarbital, street drugs or alcohol.  A corroborative history from patient's caretaker [name] and patient's daughter [name] reveals that patient has heavy alcohol problem, she has episodes of withdrawals in the past and hospitalizations. Remains in the hospital with confusion and disorientation.   Assessment & Plan:   Principal Problem:   NSTEMI (non-ST elevated myocardial infarction) (HCC) Active Problems:   Elevated troponin   Essential hypertension   Non-STEMI (non-ST elevated myocardial infarction) (HCC)   Alcohol withdrawal syndrome, with delirium (HCC)   Prolonged QT interval  Non-ST elevation MI: diffuse T wave changes on presentation.  No history of cardiac stent.  Currently chest pain-free.  Minimally elevated troponins thought to be demand ischemia.  Seen by cardiology.  Advised conservative management. Patient on aspirin, statin and beta-blockers. Heparin infusion for 48 hours.  Completed. Echocardiogram with ejection fraction 35%, euvolemic.  Will need ischemic evaluation after clinical improvement as per cardiology. We will start mobilizing.  Altered mental status/acute metabolic  encephalopathy/postictal or alcohol withdrawal.  Also suspect other drug withdrawals. Patient developed florid alcohol withdrawal syndrome. Treated with CIWA scale with benzodiazepine withdrawal protocol with improvement. Repeat EEGs were stable.  MRI today shows no acute findings. On thiamine high doses 500 mg 3 times a day, will continue until hospitalization and change to oral. Patient with adequate improvement today, start mobilizing with PT OT. Continue seizure precautions and delirium precautions. Followed by neurology.  Chronic pain syndrome: Patient on chronic opiates.  Resume oxycodone lordosis.  Prolonged QT: QTC remains 530 despite aggressive electrolyte replacement.  Will avoid other QT prolonging medications.  Patient on Cymbalta that we will go back to regular doses.  Anxiety/depression: On Cymbalta.  Will increase to home doses.  Reduced dose of oxycodone.  PT OT.  Mobilize in the hallway.  Anticipate discharge home tomorrow.   DVT prophylaxis: Lovenox subcu.   Code Status: Full code Family Communication: Patient's daughter on the phone 4/1.  Unable to pick up phone today. Disposition Plan: Status is: Inpatient  Remains inpatient appropriate because:Inpatient level of care appropriate due to severity of illness   Dispo: The patient is from: Home              Anticipated d/c is to: Home              Patient currently is not medically stable to d/c.   Difficult to place patient No         Consultants:   Cardiology  Neurology  Procedures:   EEG  Antimicrobials:   None   Subjective: Patient seen and examined.  She came back from MRI.  Today, she is more alert awake and oriented.  She feels anxious and  agitated, wants more Percocet.  Wants to go home.  Objective: Vitals:   07/13/20 1239 07/13/20 1240 07/13/20 1909 07/14/20 0927  BP: 126/86 (!) 143/89 (!) 127/91 (!) 129/91  Pulse: (!) 103 99 (!) 104 98  Resp:   18 18  Temp:   98 F (36.7 C)  98.3 F (36.8 C)  TempSrc:    Oral  SpO2: 97%  99% 94%  Weight:      Height:        Intake/Output Summary (Last 24 hours) at 07/14/2020 1202 Last data filed at 07/14/2020 0910 Gross per 24 hour  Intake 305 ml  Output 250 ml  Net 55 ml   Filed Weights   07/12/20 0047 07/13/20 0350  Weight: 74.1 kg 73.9 kg    Examination:  General exam: Chronically sick looking.  Looks anxious, however she is alert oriented x4.  No more confabulation.  Speech is clear. Respiratory system: Clear to auscultation. Respiratory effort normal.  No added sounds. Cardiovascular system: S1 & S2 heard, RRR.  No added sounds. Gastrointestinal system: Abdomen is nondistended, soft and nontender. No organomegaly or masses felt. Normal bowel sounds heard. No focal deficits.  Data Reviewed: I have personally reviewed following labs and imaging studies  CBC: Recent Labs  Lab 07/12/20 0309 07/12/20 2308 07/14/20 0415  WBC 20.3* 16.9* 15.1*  NEUTROABS 15.2*  --   --   HGB 13.7 13.5 13.1  HCT 42.5 41.6 41.0  MCV 99.8 99.5 100.2*  PLT 268 275 285   Basic Metabolic Panel: Recent Labs  Lab 07/12/20 0309 07/12/20 2308 07/13/20 0916 07/14/20 0415  NA 139  --  139 137  K 3.7  --  3.6 3.9  CL 108  --  106 104  CO2 22  --  22 22  GLUCOSE 151*  --  132* 113*  BUN 20  --  22 19  CREATININE 0.63  --  0.70 0.75  CALCIUM 8.6*  --  9.0 8.9  MG  --  3.2* 2.5* 2.1  PHOS  --  2.2* 2.2* 3.0   GFR: Estimated Creatinine Clearance: 68.1 mL/min (by C-G formula based on SCr of 0.75 mg/dL). Liver Function Tests: Recent Labs  Lab 07/12/20 0309 07/13/20 0916 07/14/20 0415  AST 36 54* 45*  ALT 25 33 34  ALKPHOS 68 69 65  BILITOT 1.7* 1.6* 1.4*  PROT 6.4* 6.5 6.5  ALBUMIN 3.0* 3.1* 3.0*   No results for input(s): LIPASE, AMYLASE in the last 168 hours. Recent Labs  Lab 07/12/20 2308  AMMONIA 26   Coagulation Profile: No results for input(s): INR, PROTIME in the last 168 hours. Cardiac Enzymes: No  results for input(s): CKTOTAL, CKMB, CKMBINDEX, TROPONINI in the last 168 hours. BNP (last 3 results) No results for input(s): PROBNP in the last 8760 hours. HbA1C: Recent Labs    07/12/20 1037  HGBA1C 6.4*   CBG: No results for input(s): GLUCAP in the last 168 hours. Lipid Profile: Recent Labs    07/12/20 1037  CHOL 154  HDL 48  LDLCALC 82  TRIG 120  CHOLHDL 3.2   Thyroid Function Tests: Recent Labs    07/12/20 0309  TSH 0.416   Anemia Panel: Recent Labs    07/12/20 2308  VITAMINB12 395   Sepsis Labs: No results for input(s): PROCALCITON, LATICACIDVEN in the last 168 hours.  Recent Results (from the past 240 hour(s))  SARS CORONAVIRUS 2 (TAT 6-24 HRS) Nasopharyngeal Nasopharyngeal Swab     Status: None  Collection Time: 07/12/20  3:13 AM   Specimen: Nasopharyngeal Swab  Result Value Ref Range Status   SARS Coronavirus 2 NEGATIVE NEGATIVE Final    Comment: (NOTE) SARS-CoV-2 target nucleic acids are NOT DETECTED.  The SARS-CoV-2 RNA is generally detectable in upper and lower respiratory specimens during the acute phase of infection. Negative results do not preclude SARS-CoV-2 infection, do not rule out co-infections with other pathogens, and should not be used as the sole basis for treatment or other patient management decisions. Negative results must be combined with clinical observations, patient history, and epidemiological information. The expected result is Negative.  Fact Sheet for Patients: HairSlick.nohttps://www.fda.gov/media/138098/download  Fact Sheet for Healthcare Providers: quierodirigir.comhttps://www.fda.gov/media/138095/download  This test is not yet approved or cleared by the [place] FDA and  has been authorized for detection and/or diagnosis of SARS-CoV-2 by FDA under an Emergency Use Authorization (EUA). This EUA will remain  in effect (meaning this test can be used) for the duration of the COVID-19 declaration under Se ction 564(b)(1) of the Act, 21  U.S.C. section 360bbb-3(b)(1), unless the authorization is terminated or revoked sooner.  Performed at [facility], [address] [place], [place] [address]          Radiology Studies: MR BRAIN WO CONTRAST  Result Date: 07/14/2020 CLINICAL DATA:  Mental status change. Chronic hypertension. Abnormal EKG. EXAM: MRI HEAD WITHOUT CONTRAST TECHNIQUE: Multiplanar, multiecho pulse sequences of the brain and surrounding structures were obtained without intravenous contrast. COMPARISON:  CT head without contrast 07/10/2020 FINDINGS: Brain: Moderate atrophy and white matter disease is most evident in the frontal lobes bilaterally. No acute infarct, hemorrhage, or mass lesion is present. Ventricles are proportionally larger anteriorly. No significant extraaxial fluid collection is present. The internal auditory canals are within normal limits. The brainstem and cerebellum are within normal limits. Vascular: Insert normal flow Skull and upper cervical spine: Extensive cervical fusion noted. Craniocervical junction unremarkable. Midline structures within normal limits. Sinuses/Orbits: Paranasal sinuses are clear. Left mastoid effusion noted. No obstructing nasopharyngeal lesion is present. Bilateral lens replacements are noted. Globes and orbits are otherwise unremarkable. IMPRESSION: 1. No acute intracranial abnormality. 2. Moderate atrophy and white matter disease likely reflects the sequela of chronic microvascular ischemia. 3. Atrophy is most evident in the anterior frontal lobes bilaterally. 4. Left mastoid effusion. No obstructing nasopharyngeal lesion is present. Electronically Signed   By: Marin Robertshristopher  Mattern M.D.   On: 07/14/2020 11:16   EEG adult  Result Date: 07/12/2020 Rosanne Ashinghen, Hsiong, MD     07/12/2020  7:13 PM HISTORY The patient is a 65 year old woman with a history of hypertension, chronic pain, polysubstance abuse, who is being evaluated for seizure-like episodes.  TECHNICAL This study  was performed according to the 10-20 International System of Electrode Placement. This record was reviewed under the following settings: bandpass filters of 1-70 Hz, sensitivity of 7 uV/mm, a display speed of 30 mm/sec, with a 60 Hz notched filter applied as appropriate.  MEDICATIONS Keppra  FINDINGS This study was recorded in the awake state only. A posterior dominant background rhythm of 9-10 Hz is seen at times. Continuous frontotemporal muscle artifact and movement artifact obscures underlying activity throughout the entirety of the study.  HYPERVENTILATION: Not performed PHOTIC STIMULATION: Not performed  IMPRESSION This is an essentially nondiagnostic study recorded in the awake state only. Continuous frontotemporal muscle artifact and movement artifact obscures underlying cerebral activity. Consider repeating study when patient is able to cooperate.  Scheduled Meds: . aspirin EC  81 mg Oral Daily  . atorvastatin  10 mg Oral Daily  . carvedilol  12.5 mg Oral BID WC  . cholecalciferol  5,000 Units Oral Daily  . DULoxetine  60 mg Oral BID  . enoxaparin (LOVENOX) injection  40 mg Subcutaneous Q24H  . folic acid  1 mg Oral Daily  . hydrALAZINE  10 mg Oral Q8H  . lactulose  10 g Oral BID  . losartan  25 mg Oral Daily  . multivitamin with minerals  1 tablet Oral Daily  . potassium chloride  40 mEq Oral Daily   Continuous Infusions: . potassium PHOSPHATE IVPB (in mmol) Stopped (07/13/20 1557)  . thiamine injection 500 mg (07/14/20 0930)     LOS: 2 days    Time spent: 30 minutes     Dorcas Carrow, MD [facility] Pager [phone]

## 2020-07-15 LAB — COMPREHENSIVE METABOLIC PANEL
ALT: 35 U/L (ref 0–44)
AST: 41 U/L (ref 15–41)
Albumin: 3 g/dL — ABNORMAL LOW (ref 3.5–5.0)
Alkaline Phosphatase: 63 U/L (ref 38–126)
Anion gap: 12 (ref 5–15)
BUN: 10 mg/dL (ref 8–23)
CO2: 21 mmol/L — ABNORMAL LOW (ref 22–32)
Calcium: 9.1 mg/dL (ref 8.9–10.3)
Chloride: 105 mmol/L (ref 98–111)
Creatinine, Ser: 0.73 mg/dL (ref 0.44–1.00)
GFR, Estimated: >60 mL/min (ref >60)
Glucose, Bld: 112 mg/dL — ABNORMAL HIGH (ref 70–99)
Potassium: 4.1 mmol/L (ref 3.5–5.1)
Sodium: 138 mmol/L (ref 135–145)
Total Bilirubin: 1.2 mg/dL (ref 0.3–1.2)
Total Protein: 6.5 g/dL (ref 6.5–8.1)

## 2020-07-15 LAB — MAGNESIUM: Magnesium: 1.8 mg/dL (ref 1.7–2.4)

## 2020-07-15 LAB — PHOSPHORUS: Phosphorus: 3.7 mg/dL (ref 2.5–4.6)

## 2020-07-15 MED ORDER — FOLIC ACID 1 MG PO TABS: 1.0000 mg | ORAL_TABLET | Freq: Every day | ORAL | 0 refills | Status: AC

## 2020-07-15 MED ORDER — ASPIRIN 81 MG PO TBEC: 81.0000 mg | DELAYED_RELEASE_TABLET | Freq: Every day | ORAL | 11 refills | Status: DC

## 2020-07-15 MED ORDER — THIAMINE HCL 100 MG PO TABS
500.0000 mg | ORAL_TABLET | Freq: Three times a day (TID) | ORAL | Status: DC
  Administered 2020-07-15: 500 mg via ORAL
  Filled 2020-07-15: qty 5

## 2020-07-15 MED ORDER — LOSARTAN POTASSIUM 25 MG PO TABS: 25.0000 mg | ORAL_TABLET | Freq: Every day | ORAL | 0 refills | Status: DC

## 2020-07-15 MED ORDER — THIAMINE HCL 100 MG PO TABS: 100.0000 mg | ORAL_TABLET | Freq: Every day | ORAL | 0 refills | Status: AC

## 2020-07-15 MED ORDER — CARVEDILOL 12.5 MG PO TABS: 12.5000 mg | ORAL_TABLET | Freq: Two times a day (BID) | ORAL | 0 refills | Status: DC

## 2020-07-15 NOTE — Progress Notes (Signed)
Patient and significant other provided with discharge education and materials. Verbalized understanding. IV access and telemetry monitor removed. No issues noted at this time. Patient discharged with all belongings.

## 2020-07-15 NOTE — TOC Progression Note (Signed)
Transition of Care [facility]) - Progression Note    Patient Details  Name: Helen Mitchell MRN: [id number] Date of Birth: Apr 29, 1955  Transition of Care [facility]) CM/SW Contact  Jaymen Fetch, Johnnette Litter, [place] Phone Number: 07/15/2020, 11:53 AM  Clinical Narrative: [facility] team consulted for discharge planning. Call from nurse Vikki Ports regarding readiness for discharge. Consulted with Atilano Ina regarding discharge call assessment with [facility]. Misty Stanley will arrange appointment for call prior to discharge.      Expected Discharge Plan: Home/Self Care Barriers to Discharge: Continued Medical Work up  Expected Discharge Plan and Services Expected Discharge Plan: Home/Self Care         Expected Discharge Date: 07/15/20                                     Social Determinants of Health (SDOH) Interventions    Readmission Risk Interventions No flowsheet data found.

## 2020-07-15 NOTE — Discharge Summary (Signed)
Physician Discharge Summary  Helen Mitchell ZOX:[id number] DOB: 12/28/1955 DOA: 07/12/2020  PCP: Pcp, No  Admit date: 07/12/2020 Discharge date: 07/15/2020  Admitted From: Home Disposition: Home  Recommendations for Outpatient Follow-up:  1. Follow up with PCP in 1-2 weeks 2. Please obtain BMP/CBC in one week 3. Follow-up with your pain management specialist to adjust your medications. 4. Cardiology clinic will schedule follow-up with you.  Home Health: None Equipment/Devices: None  Discharge Condition: Stable CODE STATUS: Full code Diet recommendation: Low-salt diet  Discharge summary: 65 year old female with history of hypertension, chronic pain syndrome on chronic pain management with Percocet, previously on Xanax that was stopped about a month ago and is still intermittently taking, on  SSRI had witnessed seizure-like activity at home and 1 more similar episode at [facility].Patient was confused. She only tells [place] that she fell off the bed. On initial presentation to emergency room, patient was found to have minimally elevated troponins, started on heparin drip and transferred to [facility]. Patient did have multiple falls, recent about a month ago resulting in vertebral fractures. On more pain management since then. Patient denies any use of phenobarbital, street drugs or alcohol.  A corroborative history from patient's caretaker Mr. Helen Mitchell and patient's daughter Ms. Helen Mitchell reveals that patient has alcohol problem, she has episodes of withdrawals in the past and hospitalizations.  Patient remained in the hospital with confusion.  Followed by cardiology, neurology.  Ultimately stabilized and going home with caretaker.   # Non-ST elevation MI: diffuse T wave changes on presentation.  No history of cardiac stent.  Patient never had chest pain.  Minimally elevated troponins thought to be demand ischemia.  Seen by cardiology.  Advised conservative management. Patient on aspirin,  statin and beta-blockers. She was treated with heparin for 48 hours.  Remains without chest pain or acute coronary syndrome. Echocardiogram with ejection fraction 35%, euvolemic.  Will need ischemic evaluation after clinical improvement as per cardiology.  They will schedule outpatient follow-up. Patient will be discharged on aspirin, she is already on a statin.  She is on beta-blockers.  Patient is euvolemic, no indication to use diuretics at this time.  # Altered mental status/acute metabolic encephalopathy/postictal or alcohol withdrawal.  Also suspect other drug withdrawals. Patient developed florid alcohol withdrawal syndrome. Treated with CIWA scale with benzodiazepine withdrawal protocol with improvement. Multiple repeat EEGs were stable and without evidence of seizure.  MRI with no acute findings. She was treated with benzodiazepines taper, multivitamins, high-dose thiamine 500 mg 3 times a day. Mental status completely improved.  Mobility improved.  No evidence of further alcohol withdrawal. Discharging home with multivitamins, she will stop drinking alcohol, she will also stop taking any medicine that is not on her prescriptions.  # Chronic pain syndrome: Patient on chronic opiates.  Resume oxycodone on lower doses.  She is contracted with pain management clinic, unable to change her long-term pain medications, however I have advised patient and her family to talk to pain management specialist to titrate her medications. Patient may not need higher doses of opiates.  # Prolonged QT: QTC remains 530 despite aggressive electrolyte replacement.  Will avoid other QT prolonging medications.  Patient on Cymbalta that we will go back to regular doses.  # Anxiety/depression: On Cymbalta.    Resume home doses.  Patient is adequately stabilized.  Mental status has improved.  Has been mobilized in the hallway with no deficits.  Has good support system at home.  Able to go home.  Discharge  Diagnoses:  Principal Problem:   NSTEMI (non-ST elevated myocardial infarction) (HCC) Active Problems:   Elevated troponin   Essential hypertension   Non-STEMI (non-ST elevated myocardial infarction) (HCC)   Alcohol withdrawal syndrome, with delirium (HCC)   Prolonged QT interval    Discharge Instructions  Discharge Instructions    Call MD for:  difficulty breathing, headache or visual disturbances   Complete by: As directed    Call MD for:  persistant dizziness or light-headedness   Complete by: As directed    Diet - low sodium heart healthy   Complete by: As directed    Discharge instructions   Complete by: As directed    Follow up with your pain management specialist to review your medications.  Take half of the percocet doses that you have at home   Increase activity slowly   Complete by: As directed      Allergies as of 07/15/2020   No Known Allergies     Medication List    STOP taking these medications   hydrochlorothiazide 12.5 MG capsule Commonly known as: MICROZIDE   tiZANidine 4 MG tablet Commonly known as: ZANAFLEX     TAKE these medications   alendronate 70 MG tablet Commonly known as: FOSAMAX Take 70 mg by mouth once a week. Saturday   aspirin 81 MG EC tablet Take 1 tablet (81 mg total) by mouth daily. Swallow whole. Start taking on: July 16, 2020   b complex vitamins capsule Take 1 capsule by mouth daily.   Biotin 10 MG Tabs Take 10 mg by mouth daily at 6 (six) AM.   carvedilol 12.5 MG tablet Commonly known as: COREG Take 1 tablet (12.5 mg total) by mouth 2 (two) times daily with a meal. What changed:   medication strength  how much to take  when to take this   DULoxetine 60 MG capsule Commonly known as: CYMBALTA Take 60 mg by mouth 2 (two) times daily.   eletriptan 40 MG tablet Commonly known as: RELPAX Take 40 mg by mouth 2 (two) times daily as needed for migraine.   folic acid 1 MG tablet Commonly known as: FOLVITE Take 1  tablet (1 mg total) by mouth daily. Start taking on: July 16, 2020   lactulose 10 GM/15ML solution Commonly known as: CHRONULAC Take 10 g by mouth 2 (two) times daily.   losartan 25 MG tablet Commonly known as: COZAAR Take 1 tablet (25 mg total) by mouth daily. Start taking on: July 16, 2020   ondansetron 4 MG disintegrating tablet Commonly known as: ZOFRAN-ODT Take 4 mg by mouth 2 (two) times daily as needed for nausea.   oxyCODONE-acetaminophen 10-325 MG tablet Commonly known as: PERCOCET Take 1 tablet by mouth 5 (five) times daily as needed for pain.   oxymetazoline 0.05 % nasal spray Commonly known as: AFRIN Place 1 spray into both nostrils 2 (two) times daily as needed for congestion.   rosuvastatin 10 MG tablet Commonly known as: CRESTOR Take 10 mg by mouth daily.   thiamine 100 MG tablet Take 1 tablet (100 mg total) by mouth daily.   Ventolin HFA 108 (90 Base) MCG/ACT inhaler Generic drug: albuterol Inhale 2 puffs into the lungs every 4 (four) hours as needed for shortness of breath.   Vitamin D 125 MCG (5000 UT) Caps Take 5,000 Units by mouth daily.       No Known Allergies  Consultations:  Cardiology  Neurology   Procedures/Studies: MR BRAIN WO CONTRAST  Result Date: 07/14/2020 CLINICAL DATA:  Mental status change. Chronic hypertension. Abnormal EKG. EXAM: MRI HEAD WITHOUT CONTRAST TECHNIQUE: Multiplanar, multiecho pulse sequences of the brain and surrounding structures were obtained without intravenous contrast. COMPARISON:  CT head without contrast 07/10/2020 FINDINGS: Brain: Moderate atrophy and white matter disease is most evident in the frontal lobes bilaterally. No acute infarct, hemorrhage, or mass lesion is present. Ventricles are proportionally larger anteriorly. No significant extraaxial fluid collection is present. The internal auditory canals are within normal limits. The brainstem and cerebellum are within normal limits. Vascular: Insert  normal flow Skull and upper cervical spine: Extensive cervical fusion noted. Craniocervical junction unremarkable. Midline structures within normal limits. Sinuses/Orbits: Paranasal sinuses are clear. Left mastoid effusion noted. No obstructing nasopharyngeal lesion is present. Bilateral lens replacements are noted. Globes and orbits are otherwise unremarkable. IMPRESSION: 1. No acute intracranial abnormality. 2. Moderate atrophy and white matter disease likely reflects the sequela of chronic microvascular ischemia. 3. Atrophy is most evident in the anterior frontal lobes bilaterally. 4. Left mastoid effusion. No obstructing nasopharyngeal lesion is present. Electronically Signed   By: Marin Roberts M.D.   On: 07/14/2020 11:16   DG CHEST PORT 1 VIEW  Result Date: 07/12/2020 CLINICAL DATA:  Chest pain EXAM: PORTABLE CHEST 1 VIEW COMPARISON:  07/10/2020 FINDINGS: Cardiac shadow is mildly enlarged. The lungs are well aerated bilaterally. No focal infiltrate or sizable effusion is seen. No pneumothorax is noted. Old rib fractures are noted. No acute abnormality seen. IMPRESSION: No acute abnormality noted. Electronically Signed   By: Alcide Clever M.D.   On: 07/12/2020 01:40   EEG adult  Result Date: 07/15/2020 Charlsie Quest, MD     07/15/2020  8:08 AM Patient Name: Helen Mitchell MRN: [id number] Epilepsy Attending: Charlsie Quest Referring Physician/Provider: Dr Ritta Slot Date: 07/13/2020 Duration: 26 mins Patient history: The patient is a 65 year old woman with a history of hypertension, chronic pain, polysubstance abuse, who is being evaluated for seizure-like episodes. Level of alertness: Awake AEDs during EEG study: Ativan Technical aspects: This EEG study was done with scalp electrodes positioned according to the 10-20 International system of electrode placement. Electrical activity was acquired at a sampling rate of  and reviewed with a high frequency filter of  and a low frequency  filter of . EEG data were recorded continuously and digitally stored. Description: The posterior dominant rhythm consists of 9 Hz activity of moderate voltage (25-35 uV) seen predominantly in posterior head regions, symmetric and reactive to eye opening and eye closing.  Hyperventilation and photic stimulation were not performed.   IMPRESSION: This study is within normal limits. No seizures or epileptiform discharges were seen throughout the recording. Charlsie Quest   EEG adult  Result Date: 07/12/2020 Rosanne Ashing, MD     07/12/2020  7:13 PM HISTORY The patient is a 65 year old woman with a history of hypertension, chronic pain, polysubstance abuse, who is being evaluated for seizure-like episodes.  TECHNICAL This study was performed according to the 10-20 International System of Electrode Placement. This record was reviewed under the following settings: bandpass filters of 1-70 Hz, sensitivity of 7 uV/mm, a display speed of 30 mm/sec, with a 60 Hz notched filter applied as appropriate.  MEDICATIONS Keppra  FINDINGS This study was recorded in the awake state only. A posterior dominant background rhythm of 9-10 Hz is seen at times. Continuous frontotemporal muscle artifact and movement artifact obscures underlying activity throughout the entirety of the study.  HYPERVENTILATION:  Not performed PHOTIC STIMULATION: Not performed  IMPRESSION This is an essentially nondiagnostic study recorded in the awake state only. Continuous frontotemporal muscle artifact and movement artifact obscures underlying cerebral activity. Consider repeating study when patient is able to cooperate.    ECHOCARDIOGRAM COMPLETE  Result Date: 07/12/2020    ECHOCARDIOGRAM REPORT   Patient Name:   [facility] [place] Date of Exam: 07/12/2020 Medical Rec #:  [id number]    Height:       64.0 in Accession #:    [id number]   Weight:       163.4 lb Date of Birth:  04-10-1956    BSA:          1.795 m Patient Age:    64 years     BP:            149/106 mmHg Patient Gender: F            HR:           119 bpm. Exam Location:  Inpatient Procedure: 2D Echo, Intracardiac Opacification Agent, Cardiac Doppler and Color            Doppler Indications:    Chest pain  History:        Patient has no prior history of Echocardiogram examinations.                 Risk Factors:Hypertension.  Sonographer:    Neomia Dear RDCS Referring Phys: 29 Eduard Clos  Sonographer Comments: Patient was agitated at time of study. IMPRESSIONS  1. LV thrombus excluded by contrast, but there is sluggish flow in the apical portion of the LV. Left ventricular ejection fraction, by estimation, is 30 to 35%. The left ventricle has moderately decreased function. The left ventricle demonstrates regional wall motion abnormalities (see scoring diagram/findings for description). The left ventricular internal cavity size was mildly dilated. Left ventricular diastolic parameters are indeterminate.  2. Right ventricular systolic function is normal. The right ventricular size is normal.  3. The mitral valve is grossly normal. Trivial mitral valve regurgitation.  4. The aortic valve is grossly normal. Aortic valve regurgitation is not visualized. Comparison(s): No prior Echocardiogram. Conclusion(s)/Recommendation(s): There is severely reduced LV function with focal wall motion abnormalities. The apical portion of all walls is akinetic, and the mid to distal lateral, anterior, and septal walls are also akinetic. Concern would be for dominant LAD; asymmetry not typical for takotsubo cardiomyopathy but not excluded. Dr. Delton See aware of findings. FINDINGS  Left Ventricle: LV thrombus excluded by contrast, but there is sluggish flow in the apical portion of the LV. Left ventricular ejection fraction, by estimation, is 30 to 35%. The left ventricle has moderately decreased function. The left ventricle demonstrates regional wall motion abnormalities. Definity contrast agent was given IV to  delineate the left ventricular endocardial borders. The left ventricular internal cavity size was mildly dilated. There is borderline left ventricular hypertrophy. Left ventricular diastolic parameters are indeterminate.  LV Wall Scoring: The mid and distal anterior wall, mid and distal anterior septum, apical lateral segment, mid anterolateral segment, mid inferoseptal segment, and apex are akinetic. The entire inferior wall, posterior wall, basal anteroseptal segment, basal anterolateral segment, basal anterior segment, and basal inferoseptal segment are hypokinetic. Right Ventricle: The right ventricular size is normal. No increase in right ventricular wall thickness. Right ventricular systolic function is normal. Left Atrium: Left atrial size was not well visualized. Right Atrium: Right atrial size was not well visualized. Pericardium: There is no evidence of  pericardial effusion. Mitral Valve: The mitral valve is grossly normal. Trivial mitral valve regurgitation. Tricuspid Valve: The tricuspid valve is grossly normal. Tricuspid valve regurgitation is trivial. Aortic Valve: The aortic valve is grossly normal. Aortic valve regurgitation is not visualized. Aortic valve mean gradient measures 3.0 mmHg. Aortic valve peak gradient measures 5.5 mmHg. Aortic valve area, by VTI measures 3.12 cm. Pulmonic Valve: The pulmonic valve was not well visualized. Pulmonic valve regurgitation is not visualized. Aorta: The aortic root, ascending aorta and aortic arch are all structurally normal, with no evidence of dilitation or obstruction. Venous: The inferior vena cava was not well visualized. IAS/Shunts: The interatrial septum was not well visualized.  LEFT VENTRICLE PLAX 2D LVIDd:         5.00 cm     Diastology LVIDs:         2.60 cm     LV e' medial:    5.98 cm/s LV PW:         1.20 cm     LV E/e' medial:  17.2 LV IVS:        1.10 cm     LV e' lateral:   10.30 cm/s LVOT diam:     2.40 cm     LV E/e' lateral: 10.0 LV SV:          62 LV SV Index:   35 LVOT Area:     4.52 cm  LV Volumes (MOD) LV vol d, MOD A2C: 53.0 ml LV vol d, MOD A4C: 92.4 ml LV vol s, MOD A2C: 41.0 ml LV vol s, MOD A4C: 59.4 ml LV SV MOD A2C:     12.0 ml LV SV MOD A4C:     92.4 ml LV SV MOD BP:      25.2 ml RIGHT VENTRICLE RV S prime:     13.40 cm/s TAPSE (M-mode): 1.0 cm LEFT ATRIUM             Index LA diam:        3.50 cm 1.95 cm/m LA Vol (A2C):   49.7 ml 27.69 ml/m LA Vol (A4C):   26.6 ml 14.82 ml/m LA Biplane Vol: 36.3 ml 20.22 ml/m  AORTIC VALVE AV Area (Vmax):    3.55 cm AV Area (Vmean):   3.38 cm AV Area (VTI):     3.12 cm AV Vmax:           117.00 cm/s AV Vmean:          80.500 cm/s AV VTI:            0.200 m AV Peak Grad:      5.5 mmHg AV Mean Grad:      3.0 mmHg LVOT Vmax:         91.70 cm/s LVOT Vmean:        60.200 cm/s LVOT VTI:          0.138 m LVOT/AV VTI ratio: 0.69  AORTA Ao Root diam: 3.60 cm Ao Asc diam:  2.10 cm MITRAL VALVE MV Area (PHT): 8.43 cm      SHUNTS MV Decel Time: 90 msec       Systemic VTI:  0.14 m MR Peak grad:    55.7 mmHg   Systemic Diam: 2.40 cm MR Vmax:         373.00 cm/s MR PISA:         0.57 cm MR PISA Eff ROA: 4 mm MR PISA Radius:  0.30 cm MV E velocity: 103.00  cm/s MV A velocity: 93.40 cm/s MV E/A ratio:  1.10 Jodelle Red MD Electronically signed by Jodelle Red MD Signature Date/Time: 07/12/2020/1:12:30 PM    Final    (Echo, Carotid, EGD, Colonoscopy, ERCP)    Subjective: Patient seen and examined.  She was just tired after working with therapies.  No other overnight events.  Wants to go home.  She is alert oriented x4.   Discharge Exam: Vitals:   07/15/20 0907 07/15/20 1137  BP: (!) 117/92 (!) 137/100  Pulse: 98 96  Resp:  18  Temp: 98.3 F (36.8 C) 97.8 F (36.6 C)  SpO2: 97% 96%   Vitals:   07/14/20 1334 07/14/20 1947 07/15/20 0907 07/15/20 1137  BP: (!) 142/99 121/82 (!) 117/92 (!) 137/100  Pulse:  95 98 96  Resp:  18  18  Temp:  98 F (36.7 C) 98.3 F (36.8 C) 97.8  F (36.6 C)  TempSrc:   Oral   SpO2:   97% 96%  Weight:      Height:        General: Pt is alert, awake, not in acute distress Looks comfortable.  Alert oriented x4. Cardiovascular: RRR, S1/S2 +, no rubs, no gallops Respiratory: CTA bilaterally, no wheezing, no rhonchi Abdominal: Soft, NT, ND, bowel sounds + Extremities: no edema, no cyanosis    The results of significant diagnostics from this hospitalization (including imaging, microbiology, ancillary and laboratory) are listed below for reference.     Microbiology: Recent Results (from the past 240 hour(s))  SARS CORONAVIRUS 2 (TAT 6-24 HRS) Nasopharyngeal Nasopharyngeal Swab     Status: None   Collection Time: 07/12/20  3:13 AM   Specimen: Nasopharyngeal Swab  Result Value Ref Range Status   SARS Coronavirus 2 NEGATIVE NEGATIVE Final    Comment: (NOTE) SARS-CoV-2 target nucleic acids are NOT DETECTED.  The SARS-CoV-2 RNA is generally detectable in upper and lower respiratory specimens during the acute phase of infection. Negative results do not preclude SARS-CoV-2 infection, do not rule out co-infections with other pathogens, and should not be used as the sole basis for treatment or other patient management decisions. Negative results must be combined with clinical observations, patient history, and epidemiological information. The expected result is Negative.  Fact Sheet for Patients: HairSlick.no  Fact Sheet for Healthcare Providers: quierodirigir.com  This test is not yet approved or cleared by the [place] FDA and  has been authorized for detection and/or diagnosis of SARS-CoV-2 by FDA under an Emergency Use Authorization (EUA). This EUA will remain  in effect (meaning this test can be used) for the duration of the COVID-19 declaration under Se ction 564(b)(1) of the Act, 21 U.S.C. section 360bbb-3(b)(1), unless the authorization is terminated  or revoked sooner.  Performed at [facility], [address], [place], [place] [address]      Labs: BNP (last 3 results) No results for input(s): BNP in the last 8760 hours. Basic Metabolic Panel: Recent Labs  Lab 07/12/20 0309 07/12/20 2308 07/13/20 0916 07/14/20 0415 07/15/20 0332  NA 139  --  139 137 138  K 3.7  --  3.6 3.9 4.1  CL 108  --  106 104 105  CO2 22  --  22 22 21*  GLUCOSE 151*  --  132* 113* 112*  BUN 20  --  22 19 10   CREATININE 0.63  --  0.70 0.75 0.73  CALCIUM 8.6*  --  9.0 8.9 9.1  MG  --  3.2* 2.5*  2.1 1.8  PHOS  --  2.2* 2.2* 3.0 3.7   Liver Function Tests: Recent Labs  Lab 07/12/20 0309 07/13/20 0916 07/14/20 0415 07/15/20 0332  AST 36 54* 45* 41  ALT 25 33 34 35  ALKPHOS 68 69 65 63  BILITOT 1.7* 1.6* 1.4* 1.2  PROT 6.4* 6.5 6.5 6.5  ALBUMIN 3.0* 3.1* 3.0* 3.0*   No results for input(s): LIPASE, AMYLASE in the last 168 hours. Recent Labs  Lab 07/12/20 2308  AMMONIA 26   CBC: Recent Labs  Lab 07/12/20 0309 07/12/20 2308 07/14/20 0415  WBC 20.3* 16.9* 15.1*  NEUTROABS 15.2*  --   --   HGB 13.7 13.5 13.1  HCT 42.5 41.6 41.0  MCV 99.8 99.5 100.2*  PLT 268 275 285   Cardiac Enzymes: No results for input(s): CKTOTAL, CKMB, CKMBINDEX, TROPONINI in the last 168 hours. BNP: Invalid input(s): POCBNP CBG: No results for input(s): GLUCAP in the last 168 hours. D-Dimer No results for input(s): DDIMER in the last 72 hours. Hgb A1c No results for input(s): HGBA1C in the last 72 hours. Lipid Profile No results for input(s): CHOL, HDL, LDLCALC, TRIG, CHOLHDL, LDLDIRECT in the last 72 hours. Thyroid function studies No results for input(s): TSH, T4TOTAL, T3FREE, THYROIDAB in the last 72 hours.  Invalid input(s): FREET3 Anemia work up [employer]    07/12/20 2308  VITAMINB12 395   Urinalysis No results found for: COLORURINE, APPEARANCEUR, LABSPEC, PHURINE, GLUCOSEU, HGBUR, BILIRUBINUR, KETONESUR, PROTEINUR, UROBILINOGEN,  NITRITE, LEUKOCYTESUR Sepsis Labs Invalid input(s): PROCALCITONIN,  WBC,  LACTICIDVEN Microbiology Recent Results (from the past 240 hour(s))  SARS CORONAVIRUS 2 (TAT 6-24 HRS) Nasopharyngeal Nasopharyngeal Swab     Status: None   Collection Time: 07/12/20  3:13 AM   Specimen: Nasopharyngeal Swab  Result Value Ref Range Status   SARS Coronavirus 2 NEGATIVE NEGATIVE Final    Comment: (NOTE) SARS-CoV-2 target nucleic acids are NOT DETECTED.  The SARS-CoV-2 RNA is generally detectable in upper and lower respiratory specimens during the acute phase of infection. Negative results do not preclude SARS-CoV-2 infection, do not rule out co-infections with other pathogens, and should not be used as the sole basis for treatment or other patient management decisions. Negative results must be combined with clinical observations, patient history, and epidemiological information. The expected result is Negative.  Fact Sheet for Patients: HairSlick.no  Fact Sheet for Healthcare Providers: quierodirigir.com  This test is not yet approved or cleared by the [place] FDA and  has been authorized for detection and/or diagnosis of SARS-CoV-2 by FDA under an Emergency Use Authorization (EUA). This EUA will remain  in effect (meaning this test can be used) for the duration of the COVID-19 declaration under Se ction 564(b)(1) of the Act, 21 U.S.C. section 360bbb-3(b)(1), unless the authorization is terminated or revoked sooner.  Performed at [facility], [address] [place], [place] [address]      Time coordinating discharge: 32 minutes  SIGNED:   Dorcas Carrow, MD  [facility] 07/15/2020, 12:56 PM

## 2020-07-15 NOTE — Progress Notes (Signed)
   Dr. Jerral Ralph corresponded with me and is discharging Ms. Rainey.  I do not think there is any urgency on getting further testing here while an inpatient.  We will go ahead and set up outpatient follow-up visit with Dr. Antoine Poche or APP for further appropriate evaluation.  Donato Schultz, MD

## 2020-07-15 NOTE — Plan of Care (Signed)
Neurology plan of care note:   The patient's final EEG shows no evidence of seizures or epileptiform discharges.   No need for AEDs.   Have patient f/up with out patient neurology 2-4 weeks after discharge.   Recommend if patient weans off Benzos and/or ETOH, that she do this under a medical provider's care.   Jimmye Norman, MSN, APN-BC Neurology  No charge note.

## 2020-07-15 NOTE — Evaluation (Signed)
Physical Therapy Evaluation Patient Details Name: Helen Mitchell MRN: [id number] DOB: Oct 22, 1955 Today's Date: 07/15/2020   History of Present Illness  Pt is a 65 y.o. female admitted 07/12/20 with AMS, witnessed seizure-like activity at home and at [facility]; pt with elevated troponins and transferred to [facility]. Pt reports multiple falls with subsequent vertebral fxs. Workup for NSTEMI, acute metabolic encephalopathy/postictal or ETOH withdrawal. MRI negative for acute abnormality. Course complicated by AMS, CIWA. EEG 4/1 with no seizures. PMH includes HTN, chronic pain syndrome, ETOH use.    Clinical Impression  Pt presents with an overall decrease in functional mobility secondary to above. PTA, pt indep with intermittent use of SPC; lives with boyfriend who is able to assist as needed. Today, pt able to perform ADLs and mobilize with intermittent min guard for balance; pt reports limited by back pain, fatigue and weakness. Pt would benefit from continued acute PT services to maximize functional mobility and independence prior to d/c home.     Follow Up Recommendations No PT follow up;Supervision for mobility/OOB    Equipment Recommendations  None recommended by PT    Recommendations for Other Services       Precautions / Restrictions Precautions Precautions: Fall Restrictions Weight Bearing Restrictions: No      Mobility  Bed Mobility Overal bed mobility: Modified Independent             General bed mobility comments: Reaching for HHA to pull up, encouraged to perform as independently as possible; c/o back pain but did not try log roll despite cues; able to power up into sitting from near flat [facility]    Transfers Overall transfer level: Needs assistance Equipment used: Rolling walker (2 wheeled);None Transfers: Sit to/from Stand Sit to Stand: Min guard         General transfer comment: Perform sit<>stand with and without RW, min guard for balance; pt c/o weakness, denies  dizziness  Ambulation/Gait Ambulation/Gait assistance: Min guard Gait Distance (Feet): 30 Feet Assistive device: Rolling walker (2 wheeled);None Gait Pattern/deviations: Step-through pattern;Decreased stride length;Antalgic Gait velocity: Decreased   General Gait Details: Initial ambulation to sink with RW and min guard; additional gait in room without DME, pt intermittently reaching to furniture for UE support, min guard for balance; pt declines further mobility secondary to pain, weakness, fatigue  Stairs            Wheelchair Mobility    Modified Rankin (Stroke Patients Only)       Balance Overall balance assessment: Mild deficits observed, not formally tested   Sitting balance-Leahy Scale: Good Sitting balance - Comments: Able to don bilateral socks sitting EOB; reports increased back pain with unsupported sitting     Standing balance-Leahy Scale: Fair Standing balance comment: Can perform ADL tasks at sink [employer] face) without UE support                             Pertinent Vitals/Pain Pain Assessment: Faces Faces Pain Scale: Hurts little more Pain Location: Lower back Pain Descriptors / Indicators: Constant;Cramping Pain Intervention(s): Monitored during session;Repositioned    Home Living Family/patient expects to be discharged to:: Private residence Living Arrangements: Spouse/significant other (boyfriend) Available Help at Discharge: Family;Friend(s);Available PRN/intermittently Type of Home: House Home Access: Level entry     Home Layout: One level Home Equipment: Cane - single point;Walker - 2 wheels Additional Comments: Daughter-in-law present; agrees that family available to provide assist as needed    Prior Function  Level of Independence: Independent with assistive device(s)         Comments: Reports mod indep with intermittent use of SPC; typically does not use DME inside house. 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 Very  forthcoming with PMH but not as forthcoming with details regarding PLOF     Hand Dominance        Extremity/Trunk Assessment   Upper Extremity Assessment Upper Extremity Assessment: Generalized weakness    Lower Extremity Assessment Lower Extremity Assessment: Generalized weakness       Communication      Cognition Arousal/Alertness: Awake/alert Behavior During Therapy: Flat affect Overall Cognitive Status: No family/caregiver present to determine baseline cognitive functioning                                 General Comments: A&Ox4 (reports admitted due to fall out of bed, unaware of NSTEMI). Following commands appropriately, verbose with speech (specifically PMH) requiring redirection to task/current conversation topic      General Comments      Exercises     Assessment/Plan    PT Assessment Patient needs continued PT services  PT Problem List Decreased strength;Decreased balance;Decreased activity tolerance;Decreased mobility;Decreased cognition;Cardiopulmonary status limiting activity       PT Treatment Interventions DME instruction;Gait training;Functional mobility training;Therapeutic activities;Therapeutic exercise;Balance training;Patient/family education    PT Goals (Current goals can be found in the Care Plan section)  Acute Rehab PT Goals Patient Stated Goal: Return home PT Goal Formulation: With patient Time For Goal Achievement: 07/29/20 Potential to Achieve Goals: Good    Frequency Min 3X/week   Barriers to discharge        Co-evaluation               AM-PAC PT "6 Clicks" Mobility  Outcome Measure Help needed turning from your back to your side while in a flat bed without using bedrails?: None Help needed moving from lying on your back to sitting on the side of a flat bed without using bedrails?: None Help needed moving to and from a bed to a chair (including a wheelchair)?: A Little Help needed standing up from a chair  using your arms (e.g., wheelchair or bedside chair)?: A Little Help needed to walk in hospital room?: A Little Help needed climbing 3-5 steps with a railing? : A Little 6 Click Score: 20    End of Session Equipment Utilized During Treatment: Gait belt Activity Tolerance: Patient tolerated treatment well;Patient limited by fatigue;Patient limited by pain Patient left: in chair;with call bell/phone within reach;with chair alarm set Nurse Communication: Mobility status PT Visit Diagnosis: Other abnormalities of gait and mobility (R26.89);Muscle weakness (generalized) (M62.81)    Time: 2694-8546 PT Time Calculation (min) (ACUTE ONLY): 21 min   Charges:   PT Evaluation $PT Eval Moderate Complexity: 1 Mod         Ina Homes, PT, DPT Acute Rehabilitation Services  Pager [phone] Office [phone]  Malachy Chamber 07/15/2020, 9:02 AM

## 2020-07-15 NOTE — Discharge Instructions (Signed)
[facility] [address]  [place], [place] [address] [phone]  [facility] will reach out to you either this evening or tomorrow morning to schedule intake assessment for alcohol treatment. If you do not hear from them by tomorrow please contact them at the above listed number.     Nonspecific Chest Pain  Chest pain can be can the body's main blood vessel.  Redness and swelling (inflammation) around your heart.  Blood clot in your lungs. Other causes of chest pain may not be so serious. These include:  Heartburn.  Anxiety or stress.  Damage to bones or muscles in your chest.  Lung infections. Chest pain can feel like:  Pain or discomfort in your chest.  Crushing, pressure, aching, or squeezing pain.  Burning or tingling.  Dull or sharp pain that is worse when you move, cough, or take a deep breath.  Pain or discomfort that is also felt in your back, neck, jaw, shoulder, or arm, or pain that spreads to any of these areas. It is hard to know whether your pain is caused by something that is serious or something that is not so serious. So it is important to see your doctor right away if you have chest pain. Follow these instructions at home: Medicines  Take over-the-counter and prescription medicines only as told by your doctor.  If you were prescribed an antibiotic medicine, take it as told by your doctor. Do not stop taking the antibiotic even if you start to feel better. Lifestyle  Rest as told by your doctor.  Do not use any products that contain nicotine or tobacco, such as cigarettes, e-cigarettes, and chewing tobacco. If you need help quitting, ask your doctor.  Do not drink alcohol.  Make lifestyle changes as told by your doctor. These may include: ? Getting regular exercise. Ask your doctor what activities are safe for you. ? Eating a heart-healthy diet. A diet and nutrition specialist (dietitian) can help you to learn healthy eating  options. ? Staying at a healthy weight. ? Treating diabetes or high blood pressure, if needed. ? Lowering your stress. Activities such as yoga and relaxation techniques can help.   General instructions  Pay attention to any changes in your symptoms. Tell your doctor about them or any new symptoms.  Avoid any activities that cause chest pain.  Keep all follow-up visits as told by your doctor. This is important. You may need more testing if your chest pain does not go away. Contact a doctor if:  Your chest pain does not go away.  You feel depressed.  You have a fever. Get help right away if:  Your chest pain is worse.  You have a cough that gets worse, or you cough up blood.  You have very bad (severe) pain in your belly (abdomen).  You pass out (faint).  You have either of these for no clear reason: ? Sudden chest discomfort. ? Sudden discomfort in your arms, back, neck, or jaw.  You have shortness of breath at any time.  You suddenly start to sweat, or your skin gets clammy.  You feel sick to your stomach (nauseous).  You throw up (vomit).  You suddenly feel lightheaded or dizzy.  You feel very weak or tired.  Your heart starts to beat fast, or it feels like it is skipping beats. These symptoms may be an emergency. Do not wait to see if the symptoms will go away. Get medical help right away. Call your local  emergency services (911 in the [place] Do not drive yourself to the hospital. Summary  Chest pain can be caused by many different conditions. The cause may be serious and need treatment right away. If you have chest pain, see your doctor right away.  Follow your doctor's instructions for taking medicines and making lifestyle changes.  Keep all follow-up visits as told by your doctor. This includes visits for any further testing if your chest pain does not go away.  Be sure to know the signs that show that your condition has become worse. Get help right away if  you have these symptoms. This information is not intended to replace advice given to you by your health care provider. Make sure you discuss any questions you have with your health care provider. Document Revised: 10/01/2017 Document Reviewed: 10/01/2017 Elsevier Patient Education  2021 [employer].                  Intensive Outpatient Programs  [facility]    [facility] [address]     [address] [place],  [place]     [place], [place] [phone]      [phone]  [facility]   [facility]  (Inpatient and outpatient)  [phone] (Suboxone and Methadone) [address]           [phone]           [facility]    [facility] - Intensive Outpatient [address]     [address] [place], [place] [address]     [place], [place]  [phone]      [phone]  [facility] (Outpatient, Inpatient, [facility]  [facility] (Groups and [facility]) (insurance only) [phone]    [place], [place]          [phone]       [facility]    [facility] (for caregivers and family) [address]     [address] [place], [place]     [place], [place] [phone]      [phone]  Residential Treatment Programs  [facility]  [place](2 years) Residential: 90 days)  [facility] ([facility] [address] [facility]      [address] [place], [place]     [place], [place] [phone]      [phone] or [phone]  [facility]    [facility] [address]      [address] [place], [place]     [place], [place] [phone]      [phone]  [facility]idential Treatment Facility   [facility] ([facility]) [address]     [address] [place], [place] [address]     [place],  [place] [phone]      [phone] Admissions: 8am-3pm M-F  [facility]: Residential Program [phone] Days)              [facility]: [facility]  [place], [place]     [place], [place]  [phone] or [phone]    (Walk in Hours over the weekend or by referral)   [facility] Crisis: [facility]:[phone] (for crisis response 24 hours a day)

## 2020-07-15 NOTE — Procedures (Signed)
Patient Name: Helen Mitchell  MRN: [id number]  Epilepsy Attending: Charlsie Quest  Referring Physician/Provider: Dr Ritta Slot Date: 07/13/2020 Duration: 26 mins  Patient history: The patient is a 65 year old woman with a history of hypertension, chronic pain, polysubstance abuse, who is being evaluated for seizure-like episodes.   Level of alertness: Awake  AEDs during EEG study: Ativan  Technical aspects: This EEG study was done with scalp electrodes positioned according to the 10-20 International system of electrode placement. Electrical activity was acquired at a sampling rate of 500Hz  and reviewed with a high frequency filter of 70Hz  and a low frequency filter of 1Hz . EEG data were recorded continuously and digitally stored.   Description: The posterior dominant rhythm consists of 9 Hz activity of moderate voltage (25-35 uV) seen predominantly in posterior head regions, symmetric and reactive to eye opening and eye closing.  Hyperventilation and photic stimulation were not performed.     IMPRESSION: This study is within normal limits. No seizures or epileptiform discharges were seen throughout the recording.   Trayquan Kolakowski 

## 2020-07-20 MED FILL — Midazolam HCl Inj 5 MG/5ML (Base Equivalent): INTRAMUSCULAR | Qty: 5 | Status: AC

## 2020-07-25 LAB — VITAMIN B1: Vitamin B1 (Thiamine): 249.9 nmol/L — ABNORMAL HIGH (ref 66.5–200.0)

## 2020-08-09 ENCOUNTER — Ambulatory Visit: Payer: Medicare Other | Admitting: Family Medicine
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
## 2020-08-24 ENCOUNTER — Telehealth: Payer: Self-pay | Admitting: Family Medicine

## 2020-08-24 ENCOUNTER — Encounter: Payer: Self-pay | Admitting: Family Medicine

## 2020-08-24 ENCOUNTER — Ambulatory Visit (INDEPENDENT_AMBULATORY_CARE_PROVIDER_SITE_OTHER): Payer: Medicare Other | Admitting: Family Medicine

## 2020-08-24 ENCOUNTER — Encounter: Payer: Self-pay | Admitting: *Deleted

## 2020-08-24 ENCOUNTER — Other Ambulatory Visit: Payer: Self-pay

## 2020-08-24 VITALS — BP 92/62 | HR 76 | Ht 64.0 in | Wt 171.0 lb

## 2020-08-24 DIAGNOSIS — R9431 Abnormal electrocardiogram [ECG] [EKG]: Secondary | ICD-10-CM

## 2020-08-24 DIAGNOSIS — F10931 Alcohol use, unspecified with withdrawal delirium: Secondary | ICD-10-CM

## 2020-08-24 DIAGNOSIS — I214 Non-ST elevation (NSTEMI) myocardial infarction: Secondary | ICD-10-CM

## 2020-08-24 DIAGNOSIS — I10 Essential (primary) hypertension: Secondary | ICD-10-CM

## 2020-08-24 DIAGNOSIS — F10231 Alcohol dependence with withdrawal delirium: Secondary | ICD-10-CM | POA: Diagnosis not present

## 2020-08-24 DIAGNOSIS — R079 Chest pain, unspecified: Secondary | ICD-10-CM

## 2020-08-24 MED ORDER — NITROGLYCERIN 0.4 MG SL SUBL: 0.4000 mg | SUBLINGUAL_TABLET | SUBLINGUAL | 3 refills | Status: DC | PRN

## 2020-08-24 MED ORDER — CARVEDILOL 12.5 MG PO TABS: 12.5000 mg | ORAL_TABLET | Freq: Two times a day (BID) | ORAL | 3 refills | Status: DC

## 2020-08-24 MED ORDER — LOSARTAN POTASSIUM 25 MG PO TABS: 25.0000 mg | ORAL_TABLET | Freq: Every day | ORAL | 3 refills | Status: DC

## 2020-08-24 NOTE — Patient Instructions (Addendum)
Medication Instructions:   Nitroglycerin as needed.  Continue all other medications.    Labwork: none  Testing/Procedures:  Your physician has requested that you have a lexiscan myoview. For further information please visit [email or web address] Please follow instruction sheet, as given.  Office will contact with results via phone or letter.    Follow-Up: 6-8 weeks   Any Other Special Instructions Will Be Listed Below (If Applicable).  If you need a refill on your cardiac medications before your next appointment, please call your pharmacy.

## 2020-08-24 NOTE — Telephone Encounter (Signed)
Checking percert on the following patient for testing scheduled at [facility].   LEXISCAN  09/12/2020

## 2020-09-12 ENCOUNTER — Encounter (HOSPITAL_COMMUNITY)
Admission: RE | Admit: 2020-09-12 | Discharge: 2020-09-12 | Disposition: A | Discharge status: Home / Self Care | Payer: Medicare Other | Source: Ambulatory Visit | Attending: Family Medicine | Admitting: Family Medicine

## 2020-09-12 ENCOUNTER — Encounter (HOSPITAL_BASED_OUTPATIENT_CLINIC_OR_DEPARTMENT_OTHER)
Admission: RE | Admit: 2020-09-12 | Discharge: 2020-09-12 | Disposition: A | Discharge status: Home / Self Care | Payer: Medicare Other | Source: Ambulatory Visit | Attending: Family Medicine | Admitting: Family Medicine

## 2020-09-12 DIAGNOSIS — R079 Chest pain, unspecified: Secondary | ICD-10-CM

## 2020-09-12 LAB — NM MYOCAR MULTI W/SPECT W/WALL MOTION / EF
LV dias vol: 65 mL (ref 46–106)
LV sys vol: 23 mL
Peak HR: 121 {beats}/min
RATE: 0.39
Rest HR: 90 {beats}/min
SDS: 2
SRS: 1
SSS: 3
TID: 1.31

## 2020-09-12 MED ORDER — REGADENOSON 0.4 MG/5ML IV SOLN
INTRAVENOUS | Status: AC
  Administered 2020-09-12: 0.4 mg via INTRAVENOUS
  Filled 2020-09-12: qty 5

## 2020-09-12 MED ORDER — TECHNETIUM TC 99M TETROFOSMIN IV KIT
30.0000 | PACK | Freq: Once | INTRAVENOUS | Status: AC | PRN
  Administered 2020-09-12: 28 via INTRAVENOUS

## 2020-09-12 MED ORDER — TECHNETIUM TC 99M TETROFOSMIN IV KIT
10.0000 | PACK | Freq: Once | INTRAVENOUS | Status: AC | PRN
  Administered 2020-09-12: 9.19 via INTRAVENOUS

## 2020-09-12 MED ORDER — SODIUM CHLORIDE FLUSH 0.9 % IV SOLN
INTRAVENOUS | Status: AC
  Administered 2020-09-12: 10 mL via INTRAVENOUS
  Filled 2020-09-12: qty 10

## 2020-09-13 ENCOUNTER — Telehealth: Payer: Self-pay | Admitting: *Deleted

## 2020-09-13 NOTE — Telephone Encounter (Signed)
Lesle Chris, LPN  05/24/7987 21:19 AM EDT Back to Top     Notified patient & caregiver [facility]). Copy to pcp.

## 2020-09-13 NOTE — Telephone Encounter (Signed)
-----   Message from Netta Neat., NP sent at 09/12/2020  4:30 PM EDT ----- Please call the patient and let her know the stress test was normal.  It was considered a low risk study by the interpreting physician.  No evidence of lack of blood flow through the coronary arteries noted.
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
## 2020-10-09 ENCOUNTER — Ambulatory Visit (INDEPENDENT_AMBULATORY_CARE_PROVIDER_SITE_OTHER): Payer: Medicare Other | Admitting: Family Medicine

## 2020-10-09 ENCOUNTER — Encounter: Payer: Self-pay | Admitting: Family Medicine

## 2020-10-09 VITALS — BP 142/80 | HR 96 | Ht 64.0 in | Wt 163.6 lb

## 2020-10-09 DIAGNOSIS — F10231 Alcohol dependence with withdrawal delirium: Secondary | ICD-10-CM

## 2020-10-09 DIAGNOSIS — I214 Non-ST elevation (NSTEMI) myocardial infarction: Secondary | ICD-10-CM | POA: Diagnosis not present

## 2020-10-09 DIAGNOSIS — I10 Essential (primary) hypertension: Secondary | ICD-10-CM

## 2020-10-09 DIAGNOSIS — R9431 Abnormal electrocardiogram [ECG] [EKG]: Secondary | ICD-10-CM

## 2020-10-09 DIAGNOSIS — F10931 Alcohol use, unspecified with withdrawal delirium: Secondary | ICD-10-CM

## 2020-10-09 NOTE — Patient Instructions (Addendum)
Medication Instructions:   Your physician recommends that you continue on your current medications as directed. Please refer to the Current Medication list given to you today.  Labwork:  none  Testing/Procedures:  none  Follow-Up:  Your physician recommends that you schedule a follow-up appointment in: 3 months.  Any Other Special Instructions Will Be Listed Below (If Applicable).  If you need a refill on your cardiac medications before your next appointment, please call your pharmacy. 

## 2021-01-09 NOTE — Progress Notes (Signed)
Cardiology Office Note  Date: 01/10/2021   ID: Alithia Zavaleta, DOB 1956-03-19, MRN [id number]  PCP:  Joaquin Courts, DO  Cardiologist:  None Electrophysiologist:  None   Chief Complaint: 3 month follow up  History of Present Illness: Shirla Hodgkiss is a 65 y.o. female with a history of NSTEMI, HTN, Alcohol withdrawal syndrome, Prolonged QT interval, chronic pain syndrome, altered mental status / metabolic encephalopathy / post ictal or alcohol withdrawal.  She presented with witnessed seizure activity at home and at [facility]. Had minimally elevated troponins. She was transferred to [facility] hospital. She had diffuse T wave changes on EKG. She was seen by cardiology and recommended conservative management. She was discharged on ASA, statin, and beta blockers. Echocardiogram with EF 35%. Notes stated she would need ischemic evaluation after clinical improvement per cardiology. She was euvolemic on discharge and did not require diuretics. She developed alcohol withdrawal syndrome. She was treated with benzodiazepines, multivitamins and high dose thiamine. Multiple EEG's showed no seizure activity. She was on chronic opiate therapy for chronic pain syndrome. She had prolonged QT with QTC at 530 ms in spite of aggressive electrolytes replacement. She was on Cymbalta.  She was last here for follow-up on recent Lexiscan stress test. Stress test performed on 09/12/2020 demonstrated no ST segment deviation during stress.  Study was normal.  There were no perfusion defects consistent with prior infarct or current ischemia.  This was deemed a low risk study by interpreting physician.  EF was 55 to 65%.  He denied any current anginal symptoms or exertional symptoms.  Her significant other stated she was not very active and sat in her chair a fair amount.    She was complaining about her PCP taking her Xanax from her.  She was having problems sleeping since PCP stopped Xanax.  Blood pressure was elevated at  142/80 on arrival.  She stated she had not taken any of her antihypertensive medications today.  We discussed the results of her stress test.  She verbalized understanding.  She is here today for 51-month follow-up.  Her primary complaint is related to her back issues.  She has significant musculoskeletal arthritic issues with previous back surgeries.  He has pain down both of her legs and pain in her feet.  She sees a pain specialist.  She denies any anginal symptoms or nitroglycerin use.  Denies any significant DOE or SOB.  She is not very active on a daily basis due to arthritis and back issues.  Blood pressure is well controlled today at 110/80.  Heart rate is 94.  Her weight is down since previous visit.  Today's weight is 154.  Her current cardiac regimen includes; sublingual nitroglycerin as needed, torsemide 5 mg daily, carvedilol 12.5 mg p.o. twice daily, losartan 25 mg daily, aspirin 81 mg daily, Crestor 10 mg daily.  Past Medical History:  Diagnosis Date   Anxiety    Hypertension     Past Surgical History:  Procedure Laterality Date   spinal fusion      Current Outpatient Medications  Medication Sig Dispense Refill   alendronate (FOSAMAX) 70 MG tablet Take 70 mg by mouth once a week. Saturday     aspirin EC 81 MG EC tablet Take 1 tablet (81 mg total) by mouth daily. Swallow whole. 30 tablet 11   b complex vitamins capsule Take 1 capsule by mouth daily.     Biotin 10 MG TABS Take 10 mg by mouth daily at 6 (  six) AM.     carvedilol (COREG) 12.5 MG tablet Take 12.5 mg by mouth 2 (two) times daily with a meal.     Cholecalciferol (VITAMIN D) 125 MCG (5000 UT) CAPS Take 5,000 Units by mouth daily.     DULoxetine (CYMBALTA) 60 MG capsule Take 60 mg by mouth 2 (two) times daily.     eletriptan (RELPAX) 40 MG tablet Take 40 mg by mouth 2 (two) times daily as needed for migraine.     folic acid (FOLVITE) 1 MG tablet Take 1 mg by mouth daily.     lactulose (CHRONULAC) 10 GM/15ML solution  Take 10 g by mouth 2 (two) times daily.     losartan (COZAAR) 25 MG tablet Take 25 mg by mouth daily.     nitroGLYCERIN (NITROSTAT) 0.4 MG SL tablet Place 0.4 mg under the tongue every 5 (five) minutes x 3 doses as needed for chest pain (if no relief after 2nd dose, proceed to ED for an evaluation or call 911).     ondansetron (ZOFRAN-ODT) 4 MG disintegrating tablet Take 4 mg by mouth 2 (two) times daily as needed for nausea.     oxyCODONE-acetaminophen (PERCOCET) 10-325 MG tablet Take 1 tablet by mouth 5 (five) times daily as needed for pain.     oxymetazoline (AFRIN) 0.05 % nasal spray Place 1 spray into both nostrils 2 (two) times daily as needed for congestion.     rosuvastatin (CRESTOR) 10 MG tablet Take 10 mg by mouth daily.     tiZANidine (ZANAFLEX) 4 MG tablet Take 4 mg by mouth 4 (four) times daily as needed.     torsemide (DEMADEX) 5 MG tablet Take 5 mg by mouth daily.     traZODone (DESYREL) 100 MG tablet Take 100 mg by mouth at bedtime.     VENTOLIN HFA 108 (90 Base) MCG/ACT inhaler Inhale 2 puffs into the lungs every 4 (four) hours as needed for shortness of breath.     No current facility-administered medications for this visit.   Allergies:  Patient has no known allergies.   Social History: The patient  reports that she has quit smoking. She has never used smokeless tobacco. She reports that she does not drink alcohol.   Family History: The patient's family history includes Diabetes in her mother; Heart disease in her father; Hypertension in her mother.   ROS:  Please see the history of present illness. Otherwise, complete review of systems is positive for none.  All other systems are reviewed and negative.   Physical Exam: VS:  BP 110/80   Pulse 94   Ht 5\' 4"  (1.626 m)   Wt 154 lb 3.2 oz (69.9 kg)   SpO2 97%   BMI 26.47 kg/m , BMI Body mass index is 26.47 kg/m.  Wt Readings from Last 3 Encounters:  01/10/21 154 lb 3.2 oz (69.9 kg)  10/09/20 163 lb 9.6 oz (74.2 kg)   08/24/20 171 lb (77.6 kg)    General: Patient appears debilitated and slightly uncomfortable comfortable at rest. Neck: Supple, no elevated JVP or carotid bruits, no thyromegaly. Lungs: Clear to auscultation, nonlabored breathing at rest. Cardiac: Regular rate and rhythm, no S3 or significant systolic murmur, no pericardial rub. Extremities: Mild non-pitting edema, distal pulses 2+. Skin: Warm and dry. Musculoskeletal: No kyphosis. Neuropsychiatric: Alert and oriented x3, affect grossly appropriate.  ECG:  EKG July 14, 2020 normal sinus rhythm rate of 99, T wave abnormality consider inferior and anterolateral ischemia, prolonged QT  Recent  Labwork: 07/12/2020: TSH 0.416 07/14/2020: Hemoglobin 13.1; Platelets 285 07/15/2020: ALT 35; AST 41; BUN 10; Creatinine, Ser 0.73; Magnesium 1.8; Potassium 4.1; Sodium 138     Component Value Date/Time   CHOL 154 07/12/2020 1037   TRIG 120 07/12/2020 1037   HDL 48 07/12/2020 1037   CHOLHDL 3.2 07/12/2020 1037   VLDL 24 07/12/2020 1037   LDLCALC 82 07/12/2020 1037    Other Studies Reviewed Today:   NST 09/12/2020 Study Result  Narrative & Impression  There was no ST segment deviation noted during stress. The study is normal. There are no perfusion defects consistent with prior infarct or current ischemia. This is a low risk study. The left ventricular ejection fraction is normal (55-65%).     Echocardiogram 07/12/2020 1. LV thrombus excluded by contrast, but there is sluggish flow in the apical portion of the LV. Left ventricular ejection fraction, by estimation, is 30 to 35%. The left ventricle has moderately decreased function. The left ventricle demonstrates regional wall motion abnormalities (see scoring diagram/findings for description). The left ventricular internal cavity size was mildly dilated. Left ventricular diastolic parameters are indeterminate. 2. Right ventricular systolic function is normal. The right ventricular size is  normal. 3. The mitral valve is grossly normal. Trivial mitral valve regurgitation. 4. The aortic valve is grossly normal. Aortic valve regurgitation is not visualized  Assessment and Plan:  1. CAD in native artery   2. Essential hypertension   3. Alcohol withdrawal syndrome, with delirium (HCC)   4. Alcoholic cardiomyopathy (HCC)      CAD post NSTEMI (non-ST elevated myocardial infarction) (HCC) [place] after presenting to [facility] with seizure activity with minimally elevated troponins.  She had diffuse T wave changes on EKG was seen by cardiology who recommended conservative management.  Echocardiogram demonstrated EF of 30 to 35%, positive for WMA's.  Cardiology recommended outpatient ischemic work-up once symptoms had improved.  Stress test  was interpreted as a low risk study with no evidence of perfusion defect.  Today she denies any anginal symptoms.  Continue aspirin 81 mg daily.  Sublingual nitroglycerin as needed, carvedilol 12.5 mg p.o. twice daily.  Continue Crestor 10 mg daily.  2. Essential hypertension BP well controlled today at 110/80.  To continue losartan 25 mg p.o. daily.  Continue carvedilol 12.5 mg p.o. twice daily.  3. Alcohol withdrawal syndrome, with delirium (HCC) No signs or symptoms of alcohol withdrawal today.  Patient had alcohol withdrawal syndrome during recent hospital stay.  4.  Alcoholic cardiomyopathy. Please get a repeat echocardiogram prior to next follow-up to reassess alcoholic cardiomyopathy.   Medication Adjustments/Labs and Tests Ordered: Current medicines are reviewed at length with the patient today.  Concerns regarding medicines are outlined above.   Disposition: Follow-up with Dr. Wyline Mood or APP 6 months  Signed, Rennis Harding, NP 01/10/2021 4:03 PM    [facility] at [facility] [address] [place], [place], [place] [address] Phone: [phone]; Fax: [phone]

## 2021-01-10 ENCOUNTER — Encounter: Payer: Self-pay | Admitting: Family Medicine

## 2021-01-10 ENCOUNTER — Ambulatory Visit (INDEPENDENT_AMBULATORY_CARE_PROVIDER_SITE_OTHER): Payer: Medicare Other | Admitting: Family Medicine

## 2021-01-10 VITALS — BP 110/80 | HR 94 | Ht 64.0 in | Wt 154.2 lb

## 2021-01-10 DIAGNOSIS — I426 Alcoholic cardiomyopathy: Secondary | ICD-10-CM | POA: Diagnosis not present

## 2021-01-10 DIAGNOSIS — F10231 Alcohol dependence with withdrawal delirium: Secondary | ICD-10-CM | POA: Diagnosis not present

## 2021-01-10 DIAGNOSIS — I10 Essential (primary) hypertension: Secondary | ICD-10-CM | POA: Diagnosis not present

## 2021-01-10 DIAGNOSIS — I2510 Atherosclerotic heart disease of native coronary artery without angina pectoris: Secondary | ICD-10-CM

## 2021-01-10 DIAGNOSIS — F10931 Alcohol use, unspecified with withdrawal delirium: Secondary | ICD-10-CM

## 2021-01-10 NOTE — Patient Instructions (Addendum)
Medication Instructions:  Your physician recommends that you continue on your current medications as directed. Please refer to the Current Medication list given to you today.  Labwork: none  Testing/Procedures: Your physician has requested that you have an echocardiogram in 1 month. Echocardiography is a painless test that uses sound waves to create images of your heart. It provides your doctor with information about the size and shape of your heart and how well your heart's chambers and valves are working. This procedure takes approximately one hour. There are no restrictions for this procedure.  Follow-Up: Your physician recommends that you schedule a follow-up appointment in: 6 months  Any Other Special Instructions Will Be Listed Below (If Applicable).  If you need a refill on your cardiac medications before your next appointment, please call your pharmacy.

## 2021-01-10 NOTE — Addendum Note (Signed)
Addended by: Eustace Moore on: 01/10/2021 04:14 PM   Modules accepted: Orders

## 2021-03-21 ENCOUNTER — Ambulatory Visit (INDEPENDENT_AMBULATORY_CARE_PROVIDER_SITE_OTHER): Payer: Medicare Other

## 2021-03-21 ENCOUNTER — Other Ambulatory Visit: Payer: Self-pay

## 2021-03-21 DIAGNOSIS — I426 Alcoholic cardiomyopathy: Secondary | ICD-10-CM

## 2021-03-21 DIAGNOSIS — I2510 Atherosclerotic heart disease of native coronary artery without angina pectoris: Secondary | ICD-10-CM | POA: Diagnosis not present

## 2021-03-21 LAB — ECHOCARDIOGRAM COMPLETE
Area-P 1/2: 9.6 cm2
Calc EF: 64.6 %
S' Lateral: 3.47 cm
Single Plane A2C EF: 62.1 %
Single Plane A4C EF: 69 %

## 2021-03-22 ENCOUNTER — Telehealth: Payer: Self-pay | Admitting: *Deleted

## 2021-03-22 NOTE — Telephone Encounter (Signed)
Patient's caregiver [facility] informed. Copy sent to PCP

## 2021-03-22 NOTE — Telephone Encounter (Signed)
-----   Message from Jonelle Sidle, MD sent at 03/21/2021  5:19 PM EST ----- Echocardiogram was ordered by Mr. Vincenza Hews NP and routed to me.  I reviewed the chart and it looks like patient is to follow-up with Dr. Wyline Mood based on the most recent clinic note.  Echocardiogram demonstrates normal LVEF at 60 to 65%, evidence of moderate pulmonary hypertension with RVSP 45 mmHg, otherwise mild mitral regurgitation.  Importantly, LVEF has normalized compared to the previous study.  Please make sure the patient has follow-up scheduled with Dr. Wyline Mood.

## 2021-06-18 ENCOUNTER — Ambulatory Visit: Payer: Medicare Other | Admitting: Cardiology

## 2021-08-28 ENCOUNTER — Other Ambulatory Visit: Payer: Self-pay | Admitting: *Deleted

## 2021-08-28 MED ORDER — LOSARTAN POTASSIUM 25 MG PO TABS: 25.0000 mg | ORAL_TABLET | Freq: Every day | ORAL | 0 refills | Status: DC

## 2021-09-02 ENCOUNTER — Other Ambulatory Visit: Payer: Self-pay

## 2021-09-02 MED ORDER — LOSARTAN POTASSIUM 25 MG PO TABS: 25.0000 mg | ORAL_TABLET | Freq: Every day | ORAL | 0 refills | Status: DC

## 2021-10-03 ENCOUNTER — Other Ambulatory Visit: Payer: Self-pay | Admitting: Student

## 2021-10-22 ENCOUNTER — Other Ambulatory Visit: Payer: Self-pay

## 2021-10-22 MED ORDER — CARVEDILOL 12.5 MG PO TABS: 12.5000 mg | ORAL_TABLET | Freq: Two times a day (BID) | ORAL | 1 refills | Status: DC

## 2021-11-13 ENCOUNTER — Encounter: Payer: Self-pay | Admitting: Cardiology

## 2021-11-13 ENCOUNTER — Ambulatory Visit: Payer: Medicare Other | Admitting: Cardiology

## 2021-11-13 ENCOUNTER — Other Ambulatory Visit (HOSPITAL_COMMUNITY)
Admission: RE | Admit: 2021-11-13 | Discharge: 2021-11-13 | Disposition: A | Discharge status: Home / Self Care | Payer: Medicare Other | Source: Ambulatory Visit | Attending: Cardiology | Admitting: Cardiology

## 2021-11-13 VITALS — BP 118/70 | HR 76 | Ht 64.0 in | Wt 161.2 lb

## 2021-11-13 DIAGNOSIS — I2510 Atherosclerotic heart disease of native coronary artery without angina pectoris: Secondary | ICD-10-CM | POA: Insufficient documentation

## 2021-11-13 DIAGNOSIS — Z01812 Encounter for preprocedural laboratory examination: Secondary | ICD-10-CM | POA: Diagnosis not present

## 2021-11-13 DIAGNOSIS — R079 Chest pain, unspecified: Secondary | ICD-10-CM | POA: Diagnosis not present

## 2021-11-13 LAB — BASIC METABOLIC PANEL
Anion gap: 8 (ref 5–15)
BUN: 10 mg/dL (ref 8–23)
CO2: 26 mmol/L (ref 22–32)
Calcium: 9.1 mg/dL (ref 8.9–10.3)
Chloride: 103 mmol/L (ref 98–111)
Creatinine, Ser: 0.76 mg/dL (ref 0.44–1.00)
GFR, Estimated: >60 mL/min (ref >60)
Glucose, Bld: 88 mg/dL (ref 70–99)
Potassium: 3.8 mmol/L (ref 3.5–5.1)
Sodium: 137 mmol/L (ref 135–145)

## 2021-11-13 MED ORDER — METOPROLOL TARTRATE 50 MG PO TABS: 50.0000 mg | ORAL_TABLET | ORAL | 0 refills | Status: DC

## 2021-11-13 NOTE — Patient Instructions (Signed)
Medication Instructions:  The current medical regimen is effective;  continue present plan and medications.  *If you need a refill on your cardiac medications before your next appointment, please call your pharmacy*   Lab Work: Please have blood work today (BMP)  If you have labs (blood work) drawn today and your tests are completely normal, you will receive your results only by: MyChart Message (if you have MyChart) OR A paper copy in the mail If you have any lab test that is abnormal or we need to change your treatment, we will call you to review the results.   Testing/Procedures:   Your cardiac CT will be scheduled at:   [facility] [address] [place], [place] [address] [phone]  Please arrive at the [facility] and Children's Entrance (Entrance C2) of [facility] 30 minutes prior to test start time. You can use the FREE valet parking offered at entrance C (encouraged to control the heart rate for the test)  Proceed to the [facility] Radiology Department (first floor) to check-in and test prep.  All radiology patients and guests should use entrance C2 at [facility], accessed from [facility], even though the hospital's physical address listed is [address].     Please follow these instructions carefully (unless otherwise directed):  On the Night Before the Test: Be sure to Drink plenty of water. Do not consume any caffeinated/decaffeinated beverages or chocolate 12 hours prior to your test. Do not take any antihistamines 12 hours prior to your test.  On the Day of the Test: Drink plenty of water until 1 hour prior to the test. Do not eat any food 4 hours prior to the test. You may take your regular medications prior to the test.  Take metoprolol (Lopressor) two hours prior to test. HOLD Furosemide/Hydrochlorothiazide morning of the test. FEMALES- please wear underwire-free bra if available, avoid dresses &  tight clothing      After the Test: Drink plenty of water. After receiving IV contrast, you may experience a mild flushed feeling. This is normal. On occasion, you may experience a mild rash up to 24 hours after the test. This is not dangerous. If this occurs, you can take Benadryl 25 mg and increase your fluid intake. If you experience trouble breathing, this can be serious. If it is severe call 911 IMMEDIATELY. If it is mild, please call our office. If you take any of these medications: Glipizide/Metformin, Avandament, Glucavance, please do not take 48 hours after completing test unless otherwise instructed.  We will call to schedule your test 2-4 weeks out understanding that some insurance companies will need an authorization prior to the service being performed.   For non-scheduling related questions, please contact the cardiac imaging nurse navigator should you have any questions/concerns: Rockwell [place], Cardiac Imaging Nurse Navigator Larey Brick, Cardiac Imaging Nurse Navigator [facility] Direct Office Dial: [phone]   For scheduling needs, including cancellations and rescheduling, please call [place], [phone].  Follow-Up: At [facility], you and your health needs are our priority.  As part of our continuing mission to provide you with exceptional heart care, we have created designated Provider Care Teams.  These Care Teams include your primary Cardiologist (physician) and Advanced Practice Providers (APPs -  Physician Assistants and Nurse Practitioners) who all work together to provide you with the care you need, when you need it.  We recommend signing up for the patient portal called "MyChart".  Sign up information is provided on this After Visit Summary.  MyChart is used to connect with patients for Virtual Visits (Telemedicine).  Patients are able to view lab/test results, encounter notes, upcoming appointments, etc.  Non-urgent messages can  be sent to your provider as well.   To learn more about what you can do with MyChart, go to [email or web address].    Your next appointment:   1 year(s)  The format for your next appointment:   In Person  Provider:   You may see MD or one of the following Advanced Practice Providers on your designated Care Team:   [place], PA-C  Jacolyn Reedy, PA-C {   Important Information About Sugar

## 2021-11-13 NOTE — Progress Notes (Signed)
Cardiology Office Note:    Date:  11/13/2021   ID:  Sparrow [place], DOB [birth year], MRN [id number]  PCP:  Joaquin Courts, DO   [facility] Providers Cardiologist:  None     Referring MD: Joaquin Courts, DO    History of Present Illness:    Helen Mitchell is a 66 y.o. female here for follow-up non-STEMI hypertension prior alcohol withdrawal. Goes by [facility]  Lyda Jester is her caregiver.  Has been having heart pain during stress. Can happen at rest.  Pain from prior neck fracture 2010. Left side posterior neck pain. Surgery in [place]. Neck pops at times. Feet are hurting with walking.  Chronic pain.  Previously transferred to [facility] with minimally elevated troponins and seizure-like activity with diffuse T wave changes on EKG.  Conservative management was discussed.  Echocardiogram at that time showed EF of 35%.  She did not require diuretics.  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  She had a Lexiscan stress test on 04/19/2020 that was low risk with no perfusion defects.  No recent exertional symptoms.  Back pain has been chronic.  Has been taking medications as listed below.  Past Medical History:  Diagnosis Date   Anxiety    Hypertension     Past Surgical History:  Procedure Laterality Date   spinal fusion      Current Medications: Current Meds  Medication Sig   alendronate (FOSAMAX) 70 MG tablet Take 70 mg by mouth once a week. Saturday   carvedilol (COREG) 12.5 MG tablet Take 1 tablet (12.5 mg total) by mouth 2 (two) times daily with a meal.   DULoxetine (CYMBALTA) 60 MG capsule Take 60 mg by mouth 2 (two) times daily.   eletriptan (RELPAX) 40 MG tablet Take 40 mg by mouth 2 (two) times daily as needed for migraine.   lactulose (CHRONULAC) 10 GM/15ML solution Take 10 g by mouth 2 (two) times daily.   losartan (COZAAR) 25 MG tablet Take 1 tablet by mouth once daily   metoprolol tartrate (LOPRESSOR) 50 MG tablet Take 1 tablet (50 mg total)  by mouth as directed. Take 1 tablet 2 hours before your CT   nitroGLYCERIN (NITROSTAT) 0.4 MG SL tablet Place 0.4 mg under the tongue every 5 (five) minutes x 3 doses as needed for chest pain (if no relief after 2nd dose, proceed to ED for an evaluation or call 911).   oxyCODONE-acetaminophen (PERCOCET) 10-325 MG tablet Take 1 tablet by mouth 5 (five) times daily as needed for pain.   oxymetazoline (AFRIN) 0.05 % nasal spray Place 1 spray into both nostrils 2 (two) times daily as needed for congestion.   rosuvastatin (CRESTOR) 10 MG tablet Take 10 mg by mouth daily.   tiZANidine (ZANAFLEX) 4 MG tablet Take 4 mg by mouth 4 (four) times daily as needed.   torsemide (DEMADEX) 5 MG tablet Take 5 mg by mouth daily.   traZODone (DESYREL) 100 MG tablet Take 100 mg by mouth at bedtime.   VENTOLIN HFA 108 (90 Base) MCG/ACT inhaler Inhale 2 puffs into the lungs every 4 (four) hours as needed for shortness of breath.     Allergies:   Patient has no known allergies.   Social History   Socioeconomic History   Marital status: Divorced    Spouse name: Not on file   Number of children: Not on file   Years of education: Not on file   Highest education level: Not on file  Occupational History   Not on file  Tobacco Use   Smoking status: Former   Smokeless tobacco: Never  Vaping Use   Vaping Use: Never used  Substance and Sexual Activity   Alcohol use: Never   Drug use: Not on file   Sexual activity: Not on file  Other Topics Concern   Not on file  Social History Narrative   Not on file   Social Determinants of Health   Financial Resource Strain: Not on file  Food Insecurity: Not on file  Transportation Needs: Not on file  Physical Activity: Not on file  Stress: Not on file  Social Connections: Not on file     Family History: The patient's family history includes Diabetes in her mother; Heart disease in her father; Hypertension in her mother. There is no history of CAD.  ROS:   Please  see the history of present illness.     All other systems reviewed and are negative.  EKGs/Labs/Other Studies Reviewed:    The following studies were reviewed today: Echocardiogram 03/21/2021:    1. Left ventricular ejection fraction, by estimation, is 60 to 65%. The  left ventricle has normal function. The left ventricle has no regional  wall motion abnormalities. There is mild left ventricular hypertrophy.  Left ventricular diastolic parameters  were normal.   2. Right ventricular systolic function is normal. The right ventricular  size is normal. There is moderately elevated pulmonary artery systolic  pressure. The estimated right ventricular systolic pressure is 45.5 mmHg.   3. Left atrial size was upper normal.   4. The mitral valve is grossly normal. Mild mitral valve regurgitation.   5. The aortic valve was not well visualized. Aortic valve regurgitation  is not visualized.   6. The inferior vena cava is normal in size with greater than 50%  respiratory variability, suggesting right atrial pressure of 3 mmHg.   Comparison(s): Prior images reviewed side by side. LVEF has normalized.     Recent Labs: No results found for requested labs within last 365 days.  Recent Lipid Panel    Component Value Date/Time   CHOL 154 07/12/2020 1037   TRIG 120 07/12/2020 1037   HDL 48 07/12/2020 1037   CHOLHDL 3.2 07/12/2020 1037   VLDL 24 07/12/2020 1037   LDLCALC 82 07/12/2020 1037     Risk Assessment/Calculations:              Physical Exam:    VS:  BP 118/70   Pulse 76   Ht 5\' 4"  (1.626 m)   Wt 161 lb 3.2 oz (73.1 kg)   SpO2 96%   BMI 27.67 kg/m     Wt Readings from Last 3 Encounters:  11/13/21 161 lb 3.2 oz (73.1 kg)  01/10/21 154 lb 3.2 oz (69.9 kg)  10/09/20 163 lb 9.6 oz (74.2 kg)     GEN:  Well nourished, well developed in no acute distress HEENT: Normal NECK: No JVD; No carotid bruits LYMPHATICS: No lymphadenopathy CARDIAC: RRR, no murmurs, no rubs,  gallops RESPIRATORY:  Clear to auscultation without rales, wheezing or rhonchi  ABDOMEN: Soft, non-tender, non-distended MUSCULOSKELETAL:  No edema; No deformity  SKIN: Warm and dry NEUROLOGIC:  Alert and oriented x 3 PSYCHIATRIC:  Normal affect   ASSESSMENT:    1. CAD in native artery   2. Chest pain, unspecified type   3. Pre-procedure lab exam    PLAN:    In order of problems listed above:  History of  non-ST elevation myocardial infarction/chest discomfort - Minimally elevated troponins with diffuse T wave changes on EKG, conservative management ensued.  Lexiscan stress test showed no evidence of ischemia.  EF at that time was 30 to 35%.  Repeat echocardiogram showed normalized EF. -Since she is having chest discomfort, we will check a coronary CT scan.  We will give her an additional metoprolol 50 mg to take in addition to her carvedilol 12.5 on day of scan.  Her caregiver Lyda Jester has had CABG.  He takes isosorbide.  Prior alcohol withdrawal - Overall doing well.  No recent seizures.  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  Excellent.  Primary hypertension - Continue with good blood pressure control with carvedilol 12.5 mg twice a day as well as losartan 25 mg a day.      Medication Adjustments/Labs and Tests Ordered: Current medicines are reviewed at length with the patient today.  Concerns regarding medicines are outlined above.  Orders Placed This Encounter  Procedures   CT CORONARY MORPH W/CTA COR W/SCORE W/CA W/CM &/OR WO/CM   Basic metabolic panel   Meds ordered this encounter  Medications   metoprolol tartrate (LOPRESSOR) 50 MG tablet    Sig: Take 1 tablet (50 mg total) by mouth as directed. Take 1 tablet 2 hours before your CT    Dispense:  1 tablet    Refill:  0    Patient Instructions  Medication Instructions:  The current medical regimen is effective;  continue present plan and medications.  *If you need a refill on your cardiac  medications before your next appointment, please call your pharmacy*   Lab Work: Please have blood work today (BMP)  If you have labs (blood work) drawn today and your tests are completely normal, you will receive your results only by: MyChart Message (if you have MyChart) OR A paper copy in the mail If you have any lab test that is abnormal or we need to change your treatment, we will call you to review the results.   Testing/Procedures:   Your cardiac CT will be scheduled at:   [facility] [address] [place], [place] [address] [phone]  Please arrive at the [facility] and Children's Entrance (Entrance C2) of [facility] 30 minutes prior to test start time. You can use the FREE valet parking offered at entrance C (encouraged to control the heart rate for the test)  Proceed to the [facility] Radiology Department (first floor) to check-in and test prep.  All radiology patients and guests should use entrance C2 at [facility], accessed from [facility], even though the hospital's physical address listed is [address]     Please follow these instructions carefully (unless otherwise directed):  On the Night Before the Test: Be sure to Drink plenty of water. Do not consume any caffeinated/decaffeinated beverages or chocolate 12 hours prior to your test. Do not take any antihistamines 12 hours prior to your test.  On the Day of the Test: Drink plenty of water until 1 hour prior to the test. Do not eat any food 4 hours prior to the test. You may take your regular medications prior to the test.  Take metoprolol (Lopressor) two hours prior to test. HOLD Furosemide/Hydrochlorothiazide morning of the test. FEMALES- please wear underwire-free bra if available, avoid dresses & tight clothing      After the Test: Drink plenty of water. After receiving IV contrast, you may experience a mild  flushed feeling. This is  normal. On occasion, you may experience a mild rash up to 24 hours after the test. This is not dangerous. If this occurs, you can take Benadryl 25 mg and increase your fluid intake. If you experience trouble breathing, this can be serious. If it is severe call 911 IMMEDIATELY. If it is mild, please call our office. If you take any of these medications: Glipizide/Metformin, Avandament, Glucavance, please do not take 48 hours after completing test unless otherwise instructed.  We will call to schedule your test 2-4 weeks out understanding that some insurance companies will need an authorization prior to the service being performed.   For non-scheduling related questions, please contact the cardiac imaging nurse navigator should you have any questions/concerns: [place]well [place], Cardiac Imaging Nurse Navigator Larey Brick, Cardiac Imaging Nurse Navigator [facility] Direct Office Dial: [phone]   For scheduling needs, including cancellations and rescheduling, please call [place], [phone].  Follow-Up: At [facility], you and your health needs are our priority.  As part of our continuing mission to provide you with exceptional heart care, we have created designated Provider Care Teams.  These Care Teams include your primary Cardiologist (physician) and Advanced Practice Providers (APPs -  Physician Assistants and Nurse Practitioners) who all work together to provide you with the care you need, when you need it.  We recommend signing up for the patient portal called "MyChart".  Sign up information is provided on this After Visit Summary.  MyChart is used to connect with patients for Virtual Visits (Telemedicine).  Patients are able to view lab/test results, encounter notes, upcoming appointments, etc.  Non-urgent messages can be sent to your provider as well.   To learn more about what you can do with MyChart, go to [email or web address].    Your next  appointment:   1 year(s)  The format for your next appointment:   In Person  Provider:   You may see MD or one of the following Advanced Practice Providers on your designated Care Team:   Randall An, PA-C  Jacolyn Reedy, [place] {   Important Information About Sugar         Signed, Donato Schultz, MD  11/13/2021 12:25 PM    [facility]

## 2021-11-14 ENCOUNTER — Encounter: Payer: Self-pay | Admitting: *Deleted

## 2021-11-14 NOTE — Progress Notes (Signed)
NL labs

## 2021-12-03 ENCOUNTER — Telehealth (HOSPITAL_COMMUNITY): Payer: Self-pay | Admitting: *Deleted

## 2021-12-03 NOTE — Telephone Encounter (Signed)
Reaching out to patient to offer assistance regarding upcoming cardiac imaging study; pt verbalizes understanding of appt date/time, parking situation and where to check in, pre-test NPO status and medications ordered, and verified current allergies; name and call back number provided for further questions should they arise ? ?Gurinder Toral RN Navigator Cardiac Imaging ?[facility] ?[phone] office ?[phone] cell ? ?Patient to take 50mg metoprolol tartrate two hours prior to her cardiac CT scan.  She is aware to arrive at 10:30am. ?

## 2021-12-04 ENCOUNTER — Encounter (HOSPITAL_COMMUNITY): Payer: Self-pay | Admitting: Radiology

## 2021-12-04 ENCOUNTER — Ambulatory Visit (HOSPITAL_COMMUNITY)
Admission: RE | Admit: 2021-12-04 | Discharge: 2021-12-04 | Disposition: A | Discharge status: Home / Self Care | Payer: Medicare Other | Source: Ambulatory Visit | Attending: Cardiology | Admitting: Cardiology

## 2021-12-04 DIAGNOSIS — I700 Atherosclerosis of aorta: Secondary | ICD-10-CM | POA: Diagnosis not present

## 2021-12-04 DIAGNOSIS — I2510 Atherosclerotic heart disease of native coronary artery without angina pectoris: Secondary | ICD-10-CM | POA: Insufficient documentation

## 2021-12-04 DIAGNOSIS — R079 Chest pain, unspecified: Secondary | ICD-10-CM | POA: Insufficient documentation

## 2021-12-04 MED ORDER — SODIUM CHLORIDE 0.9 % IV BOLUS
500.0000 mL | Freq: Once | INTRAVENOUS | Status: AC
  Administered 2021-12-04: 500 mL via INTRAVENOUS

## 2021-12-04 MED ORDER — NITROGLYCERIN 0.4 MG SL SUBL
SUBLINGUAL_TABLET | SUBLINGUAL | Status: AC
  Filled 2021-12-04: qty 2

## 2021-12-04 MED ORDER — NITROGLYCERIN 0.4 MG SL SUBL
0.8000 mg | SUBLINGUAL_TABLET | Freq: Once | SUBLINGUAL | Status: AC
  Administered 2021-12-04: 0.8 mg via SUBLINGUAL

## 2021-12-04 MED ORDER — IOHEXOL 350 MG/ML SOLN
100.0000 mL | Freq: Once | INTRAVENOUS | Status: AC | PRN
  Administered 2021-12-04: 100 mL via INTRAVENOUS

## 2021-12-05 ENCOUNTER — Telehealth: Payer: Self-pay

## 2021-12-05 DIAGNOSIS — I2510 Atherosclerotic heart disease of native coronary artery without angina pectoris: Secondary | ICD-10-CM

## 2021-12-05 MED ORDER — ROSUVASTATIN CALCIUM 20 MG PO TABS: 20.0000 mg | ORAL_TABLET | Freq: Every day | ORAL | 3 refills | Status: DC

## 2021-12-05 NOTE — Telephone Encounter (Signed)
The patient and caregiver have been notified of the result and verbalized understanding.  All questions (if any) were answered. Macie Burows, RN 12/05/2021 3:07 PM   Will have labs drawn at [facility]. Request a letter stating when to have labs drawn.  Advised pt that no appointment is needed so I will just send a letter telling pt around what date to get labs.

## 2021-12-05 NOTE — Telephone Encounter (Signed)
-----   Message from Jake Bathe, MD sent at 12/05/2021 12:01 PM EDT ----- Mild non obstructive coronary artery plaque. Let's increase Crestor from 10 to 20mg  PO QD Repeat lipids in 2 months.  Last LDL 82. Goal is < 70  , MD

## 2022-02-03 ENCOUNTER — Other Ambulatory Visit (HOSPITAL_COMMUNITY)
Admission: RE | Admit: 2022-02-03 | Discharge: 2022-02-03 | Disposition: A | Discharge status: Home / Self Care | Payer: Medicare Other | Source: Ambulatory Visit | Attending: Cardiology | Admitting: Cardiology

## 2022-02-03 DIAGNOSIS — I2510 Atherosclerotic heart disease of native coronary artery without angina pectoris: Secondary | ICD-10-CM | POA: Diagnosis present

## 2022-02-03 LAB — LIPID PANEL
Cholesterol: 164 mg/dL (ref 0–200)
HDL: 53 mg/dL (ref >40)
LDL Cholesterol: 72 mg/dL (ref 0–99)
Total CHOL/HDL Ratio: 3.1 RATIO
Triglycerides: 196 mg/dL — ABNORMAL HIGH (ref <150)
VLDL: 39 mg/dL (ref 0–40)

## 2022-04-25 ENCOUNTER — Other Ambulatory Visit: Payer: Self-pay | Admitting: Student

## 2022-05-15 ENCOUNTER — Encounter (HOSPITAL_COMMUNITY): Payer: Self-pay

## 2022-05-15 ENCOUNTER — Emergency Department (HOSPITAL_COMMUNITY)
Admission: EM | Admit: 2022-05-15 | Discharge: 2022-05-15 | Disposition: A | Discharge status: Home / Self Care | Payer: Medicare Other | Attending: Emergency Medicine | Admitting: Emergency Medicine

## 2022-05-15 ENCOUNTER — Other Ambulatory Visit: Payer: Self-pay

## 2022-05-15 DIAGNOSIS — Z79899 Other long term (current) drug therapy: Secondary | ICD-10-CM | POA: Insufficient documentation

## 2022-05-15 DIAGNOSIS — R109 Unspecified abdominal pain: Secondary | ICD-10-CM | POA: Diagnosis present

## 2022-05-15 DIAGNOSIS — K5900 Constipation, unspecified: Secondary | ICD-10-CM

## 2022-05-15 DIAGNOSIS — R944 Abnormal results of kidney function studies: Secondary | ICD-10-CM | POA: Diagnosis not present

## 2022-05-15 DIAGNOSIS — E871 Hypo-osmolality and hyponatremia: Secondary | ICD-10-CM | POA: Diagnosis not present

## 2022-05-15 DIAGNOSIS — I10 Essential (primary) hypertension: Secondary | ICD-10-CM | POA: Diagnosis not present

## 2022-05-15 DIAGNOSIS — N179 Acute kidney failure, unspecified: Secondary | ICD-10-CM | POA: Insufficient documentation

## 2022-05-15 LAB — COMPREHENSIVE METABOLIC PANEL
ALT: 16 U/L (ref 0–44)
AST: 19 U/L (ref 15–41)
Albumin: 3.5 g/dL (ref 3.5–5.0)
Alkaline Phosphatase: 109 U/L (ref 38–126)
Anion gap: 14 (ref 5–15)
BUN: 26 mg/dL — ABNORMAL HIGH (ref 8–23)
CO2: 22 mmol/L (ref 22–32)
Calcium: 9 mg/dL (ref 8.9–10.3)
Chloride: 96 mmol/L — ABNORMAL LOW (ref 98–111)
Creatinine, Ser: 2.26 mg/dL — ABNORMAL HIGH (ref 0.44–1.00)
GFR, Estimated: 23 mL/min — ABNORMAL LOW (ref >60)
Glucose, Bld: 96 mg/dL (ref 70–99)
Potassium: 4.3 mmol/L (ref 3.5–5.1)
Sodium: 132 mmol/L — ABNORMAL LOW (ref 135–145)
Total Bilirubin: 0.7 mg/dL (ref 0.3–1.2)
Total Protein: 7.8 g/dL (ref 6.5–8.1)

## 2022-05-15 LAB — CBC WITH DIFFERENTIAL/PLATELET
Abs Immature Granulocytes: 0.19 10*3/uL — ABNORMAL HIGH (ref 0.00–0.07)
Basophils Absolute: 0.1 10*3/uL (ref 0.0–0.1)
Basophils Relative: 1 %
Eosinophils Absolute: 0.1 10*3/uL (ref 0.0–0.5)
Eosinophils Relative: 0 %
HCT: 39.6 % (ref 36.0–46.0)
Hemoglobin: 12.3 g/dL (ref 12.0–15.0)
Immature Granulocytes: 1 %
Lymphocytes Relative: 13 %
Lymphs Abs: 1.9 10*3/uL (ref 0.7–4.0)
MCH: 30.1 pg (ref 26.0–34.0)
MCHC: 31.1 g/dL (ref 30.0–36.0)
MCV: 96.8 fL (ref 80.0–100.0)
Monocytes Absolute: 1.8 10*3/uL — ABNORMAL HIGH (ref 0.1–1.0)
Monocytes Relative: 12 %
Neutro Abs: 10.4 10*3/uL — ABNORMAL HIGH (ref 1.7–7.7)
Neutrophils Relative %: 73 %
Platelets: 310 10*3/uL (ref 150–400)
RBC: 4.09 MIL/uL (ref 3.87–5.11)
RDW: 14 % (ref 11.5–15.5)
WBC: 14.4 10*3/uL — ABNORMAL HIGH (ref 4.0–10.5)
nRBC: 0 % (ref 0.0–0.2)

## 2022-05-15 MED ORDER — SODIUM CHLORIDE 0.9 % IV BOLUS
1000.0000 mL | Freq: Once | INTRAVENOUS | Status: AC
  Administered 2022-05-15: 1000 mL via INTRAVENOUS

## 2022-05-15 MED ORDER — OXYCODONE-ACETAMINOPHEN 5-325 MG PO TABS
2.0000 | ORAL_TABLET | Freq: Once | ORAL | Status: AC
  Administered 2022-05-15: 2 via ORAL
  Filled 2022-05-15: qty 2

## 2022-05-15 NOTE — ED Triage Notes (Signed)
Pt reports no BM for 15 days and had low BP at PCP office.

## 2022-05-15 NOTE — ED Notes (Signed)
Phlebotomy at bedside for blood work

## 2022-05-15 NOTE — ED Notes (Signed)
First phlebotomist was unable to obtain lab work, second phlebotomist attempting to obtain lab work at this time

## 2022-05-15 NOTE — ED Notes (Signed)
Two Rns attempted to obtain IV access twice each, was unable to obtain IV access for fluid bolus--PA-C made aware

## 2022-05-15 NOTE — Discharge Instructions (Signed)
You have been seen today for your complaint of constipation, abdominal pain. Your lab work showed that your kidneys are not functioning as well as they normally are.  This was treated with IV fluids.  You should continue to drink plenty of water at home. Your discharge medications include your home medications. Home care instructions are as follows:  Drink plenty of water.  Avoid caffeine and sugar dense drinks Follow up with: Your primary care provider as soon as possible for reevaluation of your kidney function Please seek immediate medical care if you develop any of the following symptoms: Rectal bleeding or you pass blood clots. Severe rectal pain. Body tissue that pushes out (protrudes) from your anus. Severe pain or bloating (distension) in your abdomen. Vomiting that you cannot control. At this time there does not appear to be the presence of an emergent medical condition, however there is always the potential for conditions to change. Please read and follow the below instructions.  Do not take your medicine if  develop an itchy rash, swelling in your mouth or lips, or difficulty breathing; call 911 and seek immediate emergency medical attention if this occurs.  You may review your lab tests and imaging results in their entirety on your MyChart account.  Please discuss all results of fully with your primary care provider and other specialist at your follow-up visit.  Note: Portions of this text may have been transcribed using voice recognition software. Every effort was made to ensure accuracy; however, inadvertent computerized transcription errors may still be present.

## 2022-05-15 NOTE — ED Notes (Signed)
This RN assisted pt to toilet in room for soap suds enema, pt handle well, pt currently sitting on toilet, call bell within reach for any assistance. Pt is to notified nurse when she believes she is finished, husband is at the bedside as well.

## 2022-05-15 NOTE — ED Notes (Signed)
Pt reported she was able to to have a medium size BM after the soap suds enema.

## 2022-05-15 NOTE — ED Notes (Signed)
PA-C was able to obtain IV access via US--fluid bolus administered

## 2022-05-15 NOTE — ED Provider Notes (Signed)
[facility] Provider Note   CSN: [id number] Arrival date & time: 05/15/22  1657     History  Chief Complaint  Patient presents with   Constipation    Helen Mitchell is a 67 y.o. female.  With history of hypertension, anxiety, chronic back pain on chronic opioid therapy.  She takes Percocets daily and has been doing this for many years.  She presents today for evaluation of constipation.  She states she has not had any sort of bowel movement in the past 15 days.  She typically has 1 bowel movement every 8 to 10 days.  She has needed to be seen in the ED for similar symptoms numerous times in the past.  She has diffuse abdominal pain due to this.  She has been taking MiraLAX for at least the past 10 days.  She has also attempted Dulcolax and Fleet enemas recently with no success.  She does have associated nausea.  She denies chest pain, shortness of breath or urinary symptoms.  She then presented to the pharmacy today and was found to have low blood pressure of approximately 90/61 her normal blood pressure is 128/80.  This prompted her to come to the ED for evaluation.she states she has not been able to eat or drink for the last 3 days.  She is still passing gas.  She denies history of abdominal surgeries.   Constipation Associated symptoms: abdominal pain and nausea        Home Medications Prior to Admission medications   Medication Sig Start Date End Date Taking? Authorizing Provider  alendronate (FOSAMAX) 70 MG tablet Take 70 mg by mouth once a week. Saturday 06/25/20  Yes [provider]  Biotin 1000 MCG tablet Take 1,000 mcg by mouth daily.   Yes [provider]  carvedilol (COREG) 12.5 MG tablet TAKE 1 TABLET BY MOUTH TWICE DAILY WITH A MEAL 04/25/22  Yes Branch, Alphonse Guild, MD  DULoxetine (CYMBALTA) 60 MG capsule Take 60 mg by mouth 2 (two) times daily. 06/17/20  Yes [provider]  losartan (COZAAR) 25 MG tablet  Take 1 tablet by mouth once daily 10/04/21  Yes Strader, [place] M, PA-C  naloxone [facility]) nasal spray 4 mg/0.1 mL Place 1 spray into the nose once.   Yes [provider]  nitroGLYCERIN (NITROSTAT) 0.4 MG SL tablet Place 0.4 mg under the tongue every 5 (five) minutes x 3 doses as needed for chest pain (if no relief after 2nd dose, proceed to ED for an evaluation or call 911).   Yes [provider]  oxyCODONE-acetaminophen (PERCOCET) 10-325 MG tablet Take 1 tablet by mouth 5 (five) times daily as needed for pain. 07/03/20  Yes [provider]  oxymetazoline (AFRIN) 0.05 % nasal spray Place 1 spray into both nostrils 2 (two) times daily as needed for congestion.   Yes [provider]  rosuvastatin (CRESTOR) 20 MG tablet Take 1 tablet (20 mg total) by mouth daily. Patient taking differently: Take 10 mg by mouth daily. 12/05/21  Yes Jerline Pain, MD  tiZANidine (ZANAFLEX) 4 MG tablet Take 4 mg by mouth 4 (four) times daily as needed. 09/14/20  Yes [provider]  torsemide (DEMADEX) 5 MG tablet Take 5 mg by mouth daily. 10/03/20  Yes [provider]  traZODone (DESYREL) 100 MG tablet Take 100 mg by mouth at bedtime. 12/27/20  Yes [provider]  TYMLOS 3120 MCG/1.56ML SOPN Inject 1.56 mLs into the skin  daily. 03/27/22 05/15/22 Yes [provider]  chlorhexidine (PERIDEX) 0.12 % solution RINSE MOUTH WITH 15 ML (1 CAPFUL) FOR 30 SECONDS IN THE MORNING AND EVENING AFTER TOOTHBRUSHING. EXPECTORATE AFTER RINSING. DO NOT SWALLOW Patient not taking: Reported on 05/15/2022    [provider]      Allergies    Patient has no known allergies.    Review of Systems   Review of Systems  Gastrointestinal:  Positive for abdominal pain, constipation and nausea.  All other systems reviewed and are negative.   Physical Exam Updated Vital Signs BP (!) 127/91   Pulse 81   Temp 98 F (36.7 C) (Oral)   Resp 19   Ht 5\' 4"  (1.626 m)   Wt  72.6 kg   SpO2 99%   BMI 27.46 kg/m  Physical Exam Vitals and nursing note reviewed.  Constitutional:      General: She is in acute distress (Mild).     Appearance: She is well-developed.  HENT:     Head: Normocephalic and atraumatic.  Eyes:     Conjunctiva/sclera: Conjunctivae normal.  Cardiovascular:     Rate and Rhythm: Normal rate and regular rhythm.     Heart sounds: No murmur heard. Pulmonary:     Effort: Pulmonary effort is normal. No respiratory distress.     Breath sounds: Normal breath sounds.  Abdominal:     General: There is distension.     Palpations: Abdomen is soft.     Tenderness: There is abdominal tenderness (Generalized). There is no guarding.  Musculoskeletal:        General: No swelling.     Cervical back: Neck supple.  Skin:    General: Skin is warm and dry.     Capillary Refill: Capillary refill takes less than 2 seconds.  Neurological:     General: No focal deficit present.     Mental Status: She is alert and oriented to person, place, and time.  Psychiatric:        Mood and Affect: Mood normal.     ED Results / Procedures / Treatments   Labs (all labs ordered are listed, but only abnormal results are displayed) Labs Reviewed  COMPREHENSIVE METABOLIC PANEL - Abnormal; Notable for the following components:      Result Value   Sodium 132 (*)    Chloride 96 (*)    BUN 26 (*)    Creatinine, Ser 2.26 (*)    GFR, Estimated 23 (*)    All other components within normal limits  CBC WITH DIFFERENTIAL/PLATELET - Abnormal; Notable for the following components:   WBC 14.4 (*)    Neutro Abs 10.4 (*)    Monocytes Absolute 1.8 (*)    Abs Immature Granulocytes 0.19 (*)    All other components within normal limits    EKG None  Radiology No results found.  Procedures Ultrasound ED Peripheral IV (Provider)  Date/Time: 05/15/2022 6:35 PM  Performed by: Roylene Reason, PA-C Authorized by: Roylene Reason, PA-C   Procedure details:     Indications: hydration, hypotension and multiple failed IV attempts     Skin Prep: chlorhexidine gluconate     Location:  Right AC   Angiocath:  20 G   Bedside Ultrasound Guided: Yes     Patient tolerated procedure without complications: Yes     Dressing applied: Yes       Medications Ordered in ED Medications  sodium chloride 0.9 % bolus 1,000 mL (0 mLs Intravenous Stopped  05/15/22 2124)  oxyCODONE-acetaminophen (PERCOCET/ROXICET) 5-325 MG per tablet 2 tablet (2 tablets Oral Given 05/15/22 2021)    ED Course/ Medical Decision Making/ A&P Clinical Course as of 05/15/22 2132  Thu May 15, 2022  2002 Patient had an enema and states she did have a bowel movement.  Her abdominal pain did improve afterwards.  She has received approximately 200 cc of normal saline and is now normotensive.  She feels better.  She is requesting her home dose of Percocet.  This will be provided.  Patient will be discharged after IV fluids. [AS]    Clinical Course User Index [AS] Antoria Lanza, [place]on Folk, PA-C                             Medical Decision Making Amount and/or Complexity of Data Reviewed Labs: ordered.  This patient presents to the ED for concern of constipation, this involves an extensive number of treatment options, and is a complaint that carries with it a high risk of complications and morbidity.  The differential diagnosis includes constipation, small bowel obstruction  Co morbidities that complicate the patient evaluation  hypertension, anxiety, chronic back pain on chronic opioid therapy  My initial workup includes basic labs, IV fluids, soapsuds enema  Additional history obtained from: Nursing notes from this visit. Family significant other is present and provides a portion of the history  I ordered, reviewed and interpreted labs which include: CMP, CBC.  CMP shows hyponatremia of 132, significantly elevated creatinine of 2.26 and an elevated BUN of 26.  Baseline creatinine appears to be  approximately 0.8.  This elevation is likely secondary to dehydration as she reports decreased to 0 p.o. intake over the past 3 days.  Afebrile, initially hypotensive which improved after IV fluids.  Otherwise hemodynamically stable.  67 year old female presents ED for evaluation of constipation.  Last bowel movement was 15 days ago.  Typically has a bowel movement every 8 to 10 days.  This is likely secondary to chronic opioid therapy.  Hypotension likely secondary to dehydration as she has had little to no p.o. intake over the past 3 days due to the constipation.  After administration of IV fluids, her blood pressure increased to normal levels.  Shared decision-making conversation was held with patient regarding imaging.  She declined imaging in the ED and stated that she has had this symptom in the past and has been treated with soapsuds enemas.  This was ordered.  Immediately afterwards, patient did report a large bowel movement and improvement in her abdominal pain.  She was requesting her home dose of Percocet.  This was provided.  Labs were significant for a significant elevated creatinine of 2.26.  This is likely secondary to dehydration and decreased intake.  Shared decision making conversation was had with patient regarding admission to hospital for acute kidney injury.  She states she would much rather be treated with IV fluids and hydrate at home.  She states she will follow closely with her primary care provider for recheck of her kidney function.  Although her creatinine is significantly elevated, treatment was given in the ED with IV fluids.  She did pass her p.o. challenge in the ED.  She is otherwise hemodynamically stable and is able to follow-up with her primary care provider.  She was given strict return precautions.  Stable at discharge.  At this time there does not appear to be any evidence of an acute emergency medical condition  and the patient appears stable for discharge with appropriate  outpatient follow up. Diagnosis was discussed with patient who verbalizes understanding of care plan and is agreeable to discharge. I have discussed return precautions with patient and significant other who verbalizes understanding. Patient encouraged to follow-up with their PCP within 1 week. All questions answered.  Patient's case discussed with Dr. Rubin Payor who agrees with plan to discharge with follow-up.   Note: Portions of this report may have been transcribed using voice recognition software. Every effort was made to ensure accuracy; however, inadvertent computerized transcription errors may still be present.         Final Clinical Impression(s) / ED Diagnoses Final diagnoses:  None    Rx / DC Orders ED Discharge Orders     None         Mora Bellman 05/15/22 2148    Benjiman Core, MD 05/15/22 [phone]

## 2022-07-10 IMAGING — MR MR HEAD W/O CM
9 of 10 series · 35 of 48 positions shown · non-contrast
Comparison: CT head without contrast 07/10/2020

CLINICAL DATA: Mental status change. Chronic hypertension. Abnormal
EKG.

EXAM:
MRI HEAD WITHOUT CONTRAST
TECHNIQUE: Multiplanar, multiecho pulse sequences of the brain and surrounding
structures were obtained without intravenous contrast.

[Series 3: DWI · axial · 3.0mm · 1.09mm/px · z∈[-18,+134]mm · 8 of 104 slices shown (1 of 4)]
[im 1/104]
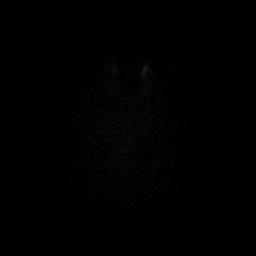
[im 12/104]
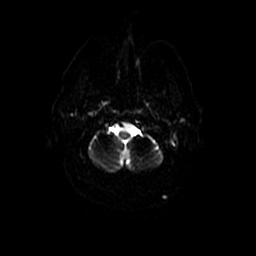
[im 35/104]
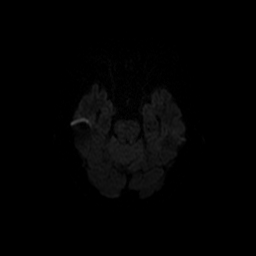
[im 46/104]
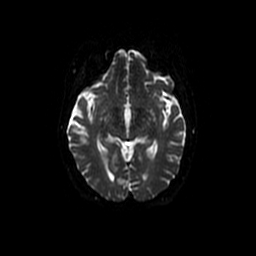
[im 58/104]
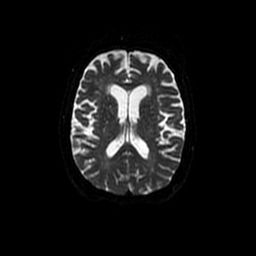
[im 69/104]
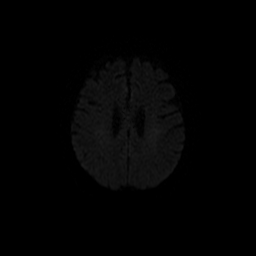
[im 92/104]
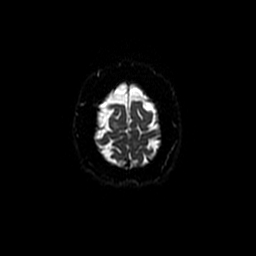
[im 104/104]
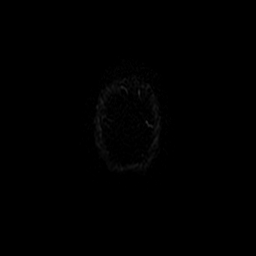

[Series 4: DWI · coronal · 5.0mm · 1.09mm/px · 7 of 70 slices shown (2 of 4)]
[im 1/70]
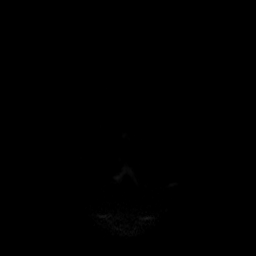
[im 12/70]
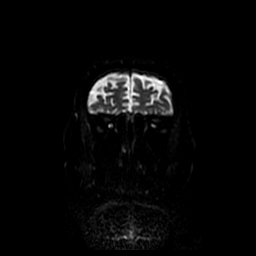
[im 24/70]
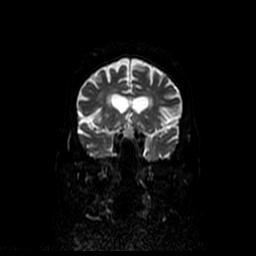
[im 35/70]
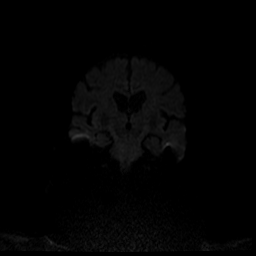
[im 47/70]
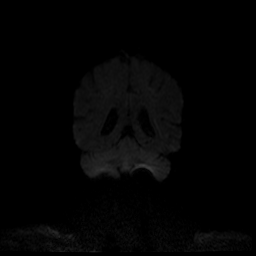
[im 58/70]
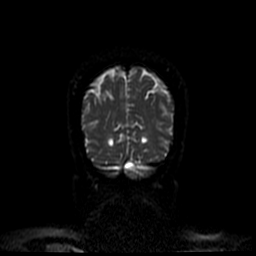
[im 70/70]
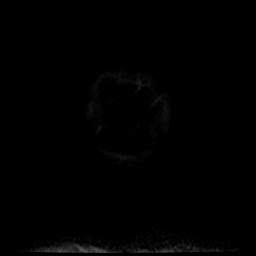

[Series 5: T1 · sagittal · 5.0mm · 0.47mm/px · 2 of 25 slices shown (1 of 2)]
[im 1/25]
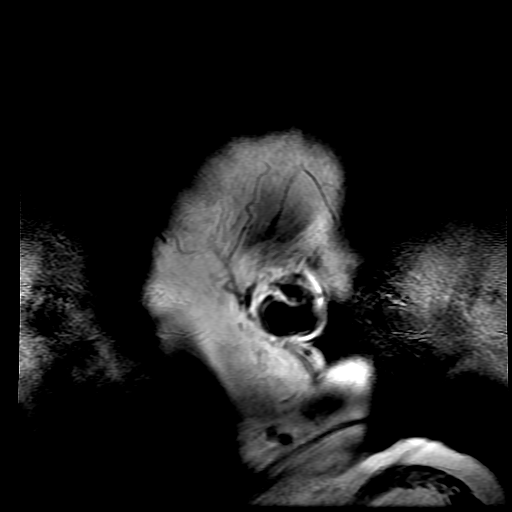
[im 25/25]
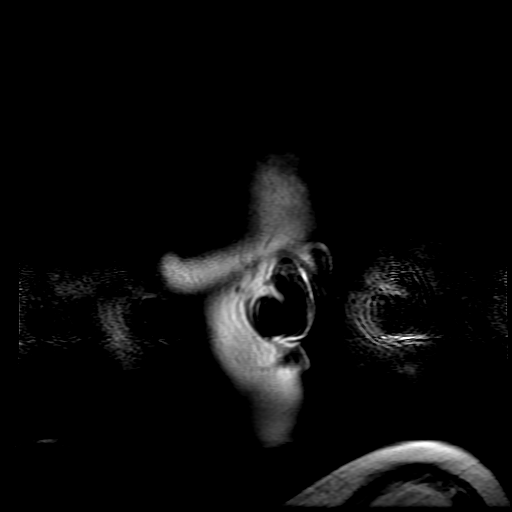

[Series 6: T2 · axial · 5.0mm · 0.43mm/px · z∈[-19,+130]mm · 3 of 26 slices shown (1 of 2)]
[im 1/26]
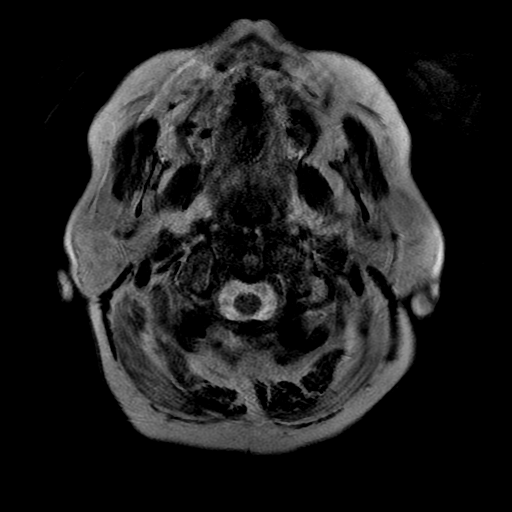
[im 13/26]
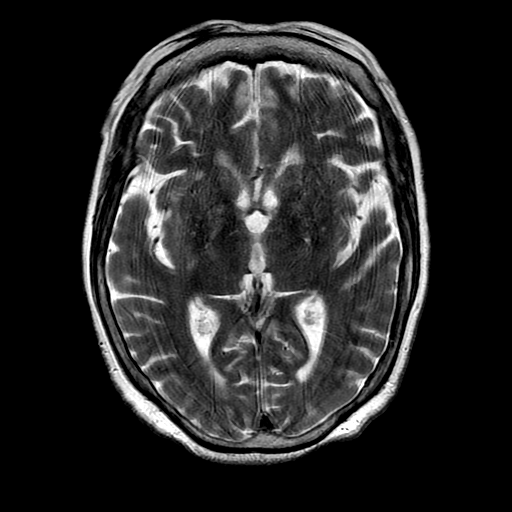
[im 26/26]
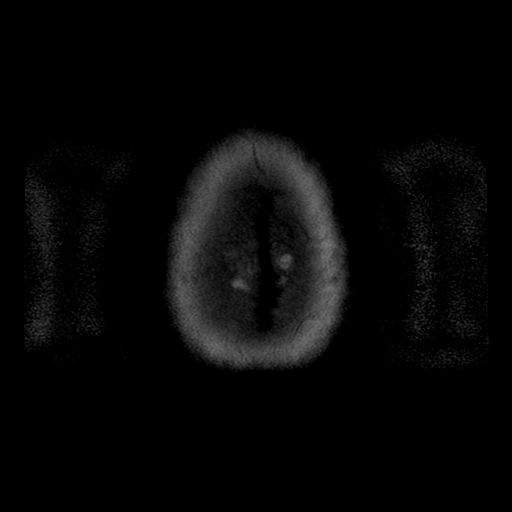

[Series 8: FLAIR · axial · 3.0mm · 0.43mm/px · z∈[-19,+130]mm · 3 of 26 slices shown]
[im 1/26]
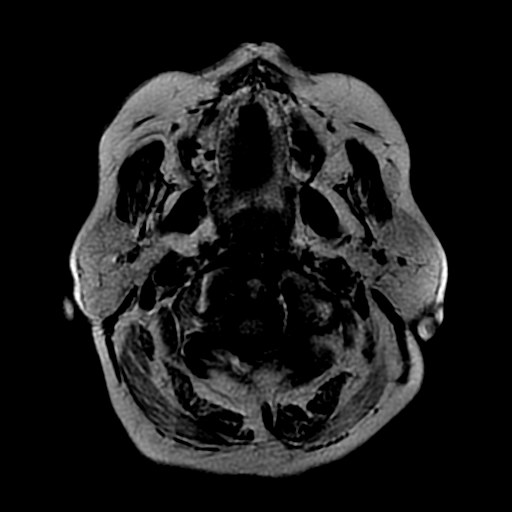
[im 13/26]
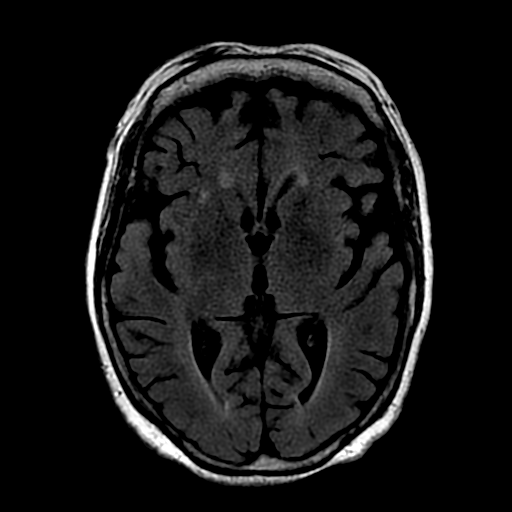
[im 26/26]
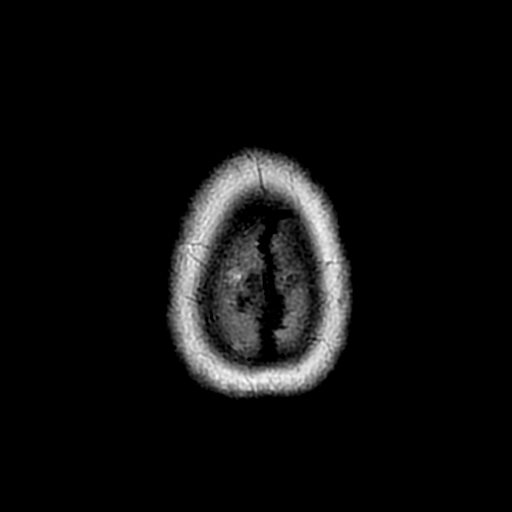

[Series 10: T1 · axial · 3.0mm · 0.47mm/px · 1 of 104 slices shown (2 of 2)]
[im 1/104]
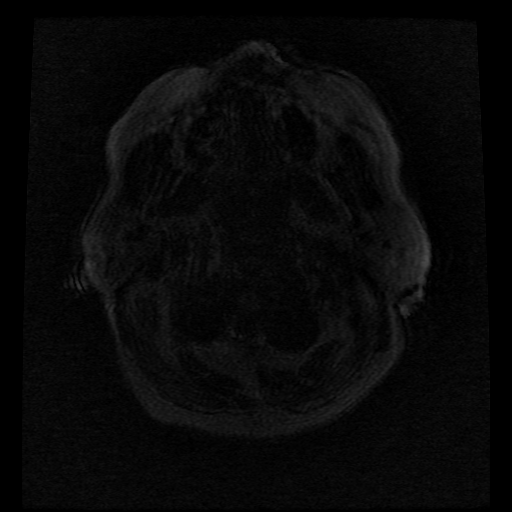

[Series 11: T2 · coronal · 5.0mm · 0.39mm/px · 3 of 27 slices shown (2 of 2)]
[im 1/27]
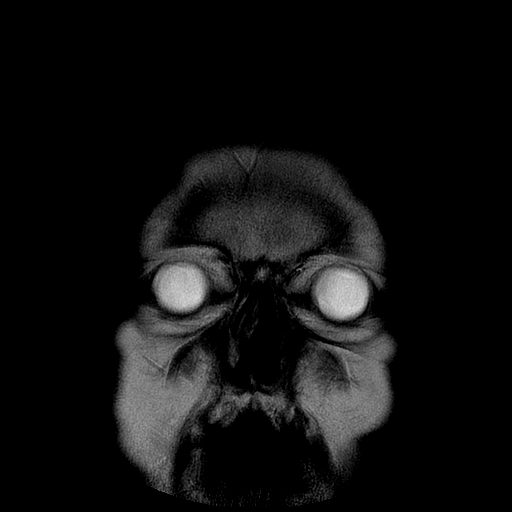
[im 14/27]
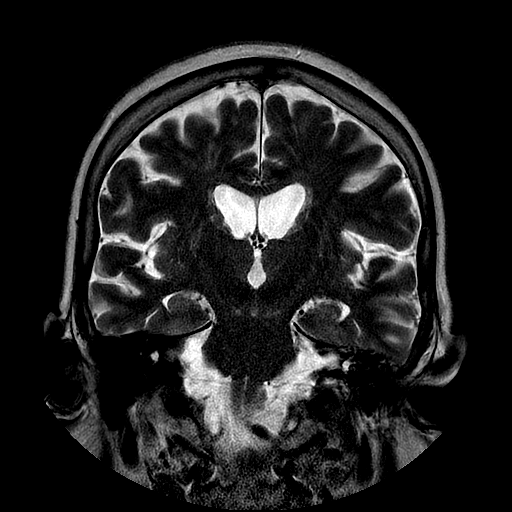
[im 27/27]
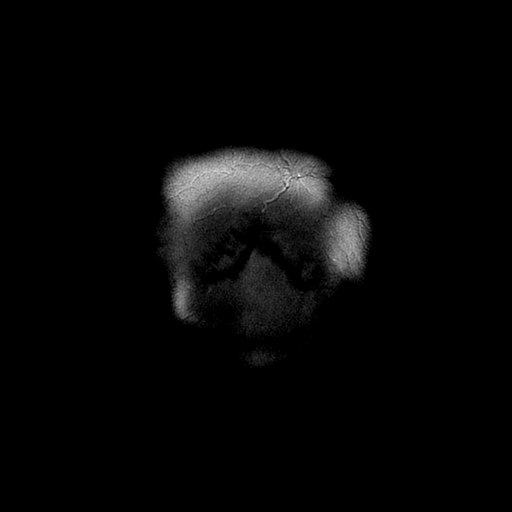

[Series 300: DWI · axial · 3.0mm · 1.09mm/px · z∈[-18,+134]mm · 5 of 52 slices shown (3 of 4)]
[im 1/52]
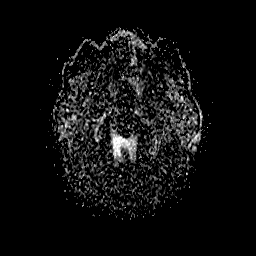
[im 13/52]
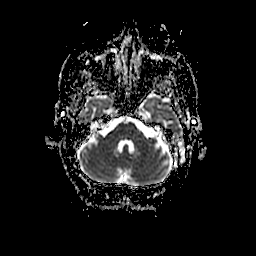
[im 26/52]
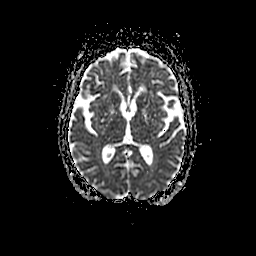
[im 39/52]
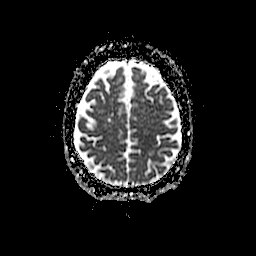
[im 52/52]
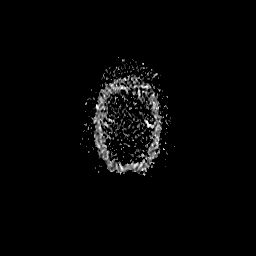

[Series 400: DWI · coronal · 5.0mm · 1.09mm/px · 3 of 35 slices shown (4 of 4)]
[im 1/35]
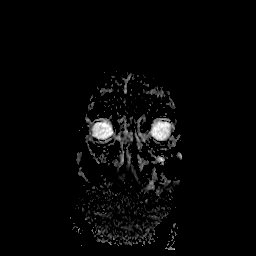
[im 18/35]
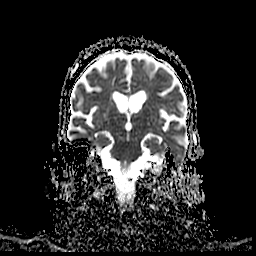
[im 35/35]
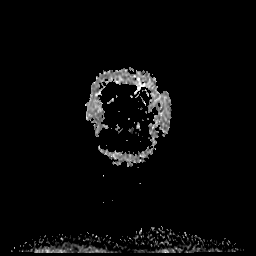

[35 of 48 positions shown; findings below may reference images not displayed]

FINDINGS: Brain: Moderate atrophy and white matter disease is most evident in
the frontal lobes bilaterally. No acute infarct, hemorrhage, or mass
lesion is present. Ventricles are proportionally larger anteriorly.
No significant extraaxial fluid collection is present.

The internal auditory canals are within normal limits. The brainstem
and cerebellum are within normal limits.

Vascular: Insert normal flow

Skull and upper cervical spine: Extensive cervical fusion noted.
Craniocervical junction unremarkable. Midline structures within
normal limits.

Sinuses/Orbits: Paranasal sinuses are clear. Left mastoid effusion
noted. No obstructing nasopharyngeal lesion is present. Bilateral
lens replacements are noted. Globes and orbits are otherwise
unremarkable.
IMPRESSION: 1. No acute intracranial abnormality.
2. Moderate atrophy and white matter disease likely reflects the
sequela of chronic microvascular ischemia.
3. Atrophy is most evident in the anterior frontal lobes
bilaterally.
4. Left mastoid effusion. No obstructing nasopharyngeal lesion is
present.

## 2022-08-08 ENCOUNTER — Other Ambulatory Visit: Payer: Self-pay | Admitting: Student

## 2022-10-22 ENCOUNTER — Telehealth: Payer: Self-pay | Admitting: Cardiology

## 2022-10-22 NOTE — Telephone Encounter (Signed)
Pt c/o swelling: STAT is pt has developed SOB within 24 hours  If swelling, where is the swelling located?   Both legs and feet  How much weight have you gained and in what time span?   No  Have you gained 3 pounds in a day or 5 pounds in a week?   No  Do you have a log of your daily weights (if so, list)?   No  Are you currently taking a fluid pill?  Yes  Are you currently SOB?  No  Have you traveled recently?  No   Caregiver stated patient's legs are so swollen "they look to be at a bursting point".  Caregiver stated patient's fluid pill was doubled while she was in hospital and that did not help.

## 2022-10-22 NOTE — Telephone Encounter (Signed)
Caregiver reports that pt has swelling to both legs from the knee down. Pt was discharged from rehab facility on June 13 and her legs started swelling soon after that..Pt was seen by PCP on June 25 and Torsemide increased to 10 mg daily. Caregiver states that pt denies increased SOB or chest pain. Please advise.

## 2022-10-23 NOTE — Telephone Encounter (Signed)
Spoke to caregiver, C. [employer], who verbalized understanding of current plan. Pt's caregiver had no further questions or concerns at this time.

## 2022-11-10 ENCOUNTER — Ambulatory Visit: Payer: Medicare Other | Attending: Nurse Practitioner | Admitting: Nurse Practitioner

## 2022-11-10 ENCOUNTER — Encounter: Payer: Self-pay | Admitting: Nurse Practitioner

## 2022-11-10 VITALS — BP 112/58 | HR 84 | Ht 64.0 in | Wt 170.0 lb

## 2022-11-10 DIAGNOSIS — R600 Localized edema: Secondary | ICD-10-CM | POA: Diagnosis not present

## 2022-11-10 DIAGNOSIS — I428 Other cardiomyopathies: Secondary | ICD-10-CM

## 2022-11-10 DIAGNOSIS — I2510 Atherosclerotic heart disease of native coronary artery without angina pectoris: Secondary | ICD-10-CM

## 2022-11-10 DIAGNOSIS — R5383 Other fatigue: Secondary | ICD-10-CM

## 2022-11-10 DIAGNOSIS — I509 Heart failure, unspecified: Secondary | ICD-10-CM | POA: Diagnosis not present

## 2022-11-10 DIAGNOSIS — R079 Chest pain, unspecified: Secondary | ICD-10-CM

## 2022-11-10 MED ORDER — LOSARTAN POTASSIUM 25 MG PO TABS: 75.0000 mg | ORAL_TABLET | Freq: Every day | ORAL | 1 refills | Status: DC

## 2022-11-10 NOTE — Progress Notes (Signed)
Cardiology Office Note:  .   Date:  11/10/2022 ID:  Helen Mitchell, DOB 10-Oct-1955, MRN [id number] PCP: Joaquin Courts, DO  [facility] Providers Cardiologist:  Donato Schultz, MD    History of Present Illness: Marland Kitchen   Helen Mitchell is a 67 y.o. female with a PMH of minimal CAD, NSTEMI, chest discomfort, HTN, alcohol-related CM, prior alcohol withdrawal, who presents today for scheduled follow-up.   Last seen by Dr. Anne Fu on November 13, 2021. She noted having chest discomfort. Coronary CT was ordered that revealed minimal non-obstructive CAD with coronary calcium score of 176. Compression fractures were noted along thoracic vertebrae along with aortic atherosclerosis, and mild atelectasis.   Today patient presents with her caregiver, Helen Mitchell who acts as her historian.  Patient is a poor historian as she is very fatigued while I am taking her history, appears to be closing her eyes / dozing off a few times as I am speaking with her.  Patient saw her PCP on May 23 this year and appears she received a vaccine and blood work and was sick the following few days.  She was sent to [facility] and was admitted from May 27 to May 31.  Was then sent to [facility] for rehab from May 31 to June 13.  Today when asking patient how she is doing, states she has not been doing well.  Chief concern is her feet have been hurting due to leg swelling.  States torsemide has not been helping.  Helen Mitchell says while at [facility] she was treated for CHF.  Helen Mitchell also says blood pressure was elevated (238 systolic) during hospitalization at one point. She also admits to neck and back pain.  Says she broke her neck about 14 years ago, and due to this, she says she "cannot do anything."  She does admit to intermittent chest pain that is triggered by stress, difficult for her to describe to me.  When asking her where this is located she states it will happen around "her hip and will go all the way down her  legs."  Then when I asked her to point to where her chest pain is located she points along her left side of her chest, says it's nonradiating, describes it as a 10/10.  Prior to entering office room for visit, CMA notified me patient was requesting a refill for her Percocet and a prescription for Xanax. Pt admits to chronic, stable DOE. Denies any palpitations, syncope, presyncope, dizziness, orthopnea, PND, acute bleeding, or claudication.  Studies Reviewed: Marland Kitchen    EKG:  EKG Interpretation Date/Time:  Monday November 10 2022 11:16:03 EDT Ventricular Rate:  84 PR Interval:  148 QRS Duration:  66 QT Interval:  380 QTC Calculation: 449 R Axis:   -11  Text Interpretation: Normal sinus rhythm Low voltage QRS Inferior infarct , age undetermined Possible Anterolateral infarct , age undetermined When compared with ECG of 14-Jul-2020 08:21, Significant changes have occurred Confirmed by Sharlene Dory [phone]) on 11/10/2022 11:43:15 AM   CCTA 11/2020: IMPRESSION: 1.  Minimal nonobstructive CAD, CADRADS = 1.   2. Coronary calcium score of 176. This was 83rd percentile for age and sex matched control.   3. Normal coronary origin with right dominance.   INTERPRETATION:   CAD-RADS 1: Minimal non-obstructive CAD (1-24%). Consider non-atherosclerotic causes of chest pain. Consider preventive therapy and risk factor modification.  Echo 03/2021:   1. Left ventricular ejection fraction, by estimation, is 60 to 65%. The  left ventricle  has normal function. The left ventricle has no regional  wall motion abnormalities. There is mild left ventricular hypertrophy.  Left ventricular diastolic parameters  were normal.   2. Right ventricular systolic function is normal. The right ventricular  size is normal. There is moderately elevated pulmonary artery systolic  pressure. The estimated right ventricular systolic pressure is 45.5 mmHg.   3. Left atrial size was upper normal.   4. The mitral valve is grossly  normal. Mild mitral valve regurgitation.   5. The aortic valve was not well visualized. Aortic valve regurgitation  is not visualized.   6. The inferior vena cava is normal in size with greater than 50%  respiratory variability, suggesting right atrial pressure of 3 mmHg.   Comparison(s): Prior images reviewed side by side. LVEF has normalized.  Lexiscan 09/2020: There was no ST segment deviation noted during stress. The study is normal. There are no perfusion defects consistent with prior infarct or current ischemia. This is a low risk study. The left ventricular ejection fraction is normal (55-65%).  Physical Exam:   VS:  BP (!) 112/58 (BP Location: Left Arm, Patient Position: Sitting, Cuff Size: Normal)   Pulse 84   Ht 5\' 4"  (1.626 m)   Wt 170 lb (77.1 kg)   SpO2 91%   BMI 29.18 kg/m    Wt Readings from Last 3 Encounters:  11/10/22 170 lb (77.1 kg)  05/15/22 160 lb (72.6 kg)  11/13/21 161 lb 3.2 oz (73.1 kg)    GEN: Well nourished, well developed in no acute distress NECK: No JVD; No carotid bruits CARDIAC: S1/S2, RRR, no murmurs, rubs, gallops RESPIRATORY:  Clear to auscultation without rales, wheezing or rhonchi  ABDOMEN: Soft, non-tender, non-distended EXTREMITIES:  Nonpitting edema BLE; No deformity   ASSESSMENT AND PLAN: .    CHF, NICM, leg edema Stage C, NYHA class II-III symptoms. Hx of alcohol-related CM. Last Echo on file revealed EF 60-65%. Will request records from [facility], want to look at Echo to evaluate EF. Weight is stable, but admits that Torsemide is not helping her leg edema. I explained to patient and Helen Mitchell that we rarely prescribe Torsemide 5 mg daily, and I will be checking her kidney function to see if this can be increased. Will obtain CMET, Mag, and proBNP within the next day or two. Continue Losartan, Toprol XL, and Torsemide at this time, however this dosage will likely be increased this week. Low sodium diet, fluid restriction <2L, and  daily weights encouraged. Educated to contact our office for weight gain of 2 lbs overnight or 5 lbs in one week. Will refill Losartan per Curtis's request.  Minimal CAD, chest pain of uncertain etiology CCTA 11/2021 revealed minimal, non-obstructive CAD and to consider nonatherosclerotic causes of chest pain.  Patient's description of her chest pain is atypical and patient is found to be not a reliable historian during the interview today.  I believe majority of her symptoms are due to a being musculoskeletal in nature due to her chronic back/neck pain. Continue atorvastatin, Toprol XL, and NTG PRN. Will continue to monitor at this time. ED precautions discussed.  Fatigue Etiology multifactorial. History of heavy alcohol problem.  Also history of alcohol related cardiomyopathy-see #1 above.  She is currently on Percocet, Lyrica, Zanaflex, and Narcan spray as needed.  Prior to entering room for office visit, CMA notified me that patient requested a refill of Percocet and a prescription for Xanax.  She was told by CMA that this can  be done by her primary care provider, as out office does not manage these prescriptions and manages cardiac medications.  Will obtain the following labs: CBC and thyroid panel in addition to lab work as mentioned above. Continue to follow with PCP.  ED precautions discussed.  Dispo: Follow-up with me or APP in 6-8 weeks or sooner if anything changes.   Signed, Sharlene Dory, NP

## 2022-11-10 NOTE — Patient Instructions (Addendum)
Medication Instructions:  Your physician recommends that you continue on your current medications as directed. Please refer to the Current Medication list given to you today.  Labwork: Today at [facility]   Testing/Procedures: None  Follow-Up: Your physician recommends that you schedule a follow-up appointment in: 6-8 weeks with Philis Nettle   Any Other Special Instructions Will Be Listed Below (If Applicable).  If you need a refill on your cardiac medications before your next appointment, please call your pharmacy.

## 2022-11-11 ENCOUNTER — Encounter: Payer: Self-pay | Admitting: Nurse Practitioner

## 2022-11-11 DIAGNOSIS — Z79899 Other long term (current) drug therapy: Secondary | ICD-10-CM

## 2022-12-02 ENCOUNTER — Other Ambulatory Visit: Payer: Self-pay

## 2022-12-02 DIAGNOSIS — Z79899 Other long term (current) drug therapy: Secondary | ICD-10-CM

## 2022-12-02 MED ORDER — TORSEMIDE 20 MG PO TABS: 20.0000 mg | ORAL_TABLET | Freq: Every day | ORAL | 3 refills | Status: DC

## 2022-12-08 ENCOUNTER — Telehealth: Payer: Self-pay | Admitting: Nurse Practitioner

## 2022-12-08 NOTE — Telephone Encounter (Signed)
Spoke with [facility] (DPR) advised him that we would need a copy of lab results before determining whether lab work needs to be done or not. Advise him will send a request to have report sent to our office. Once we receive them we will call back to let him know. [facility] verbalized understanding.

## 2022-12-08 NOTE — Telephone Encounter (Signed)
[facility] called in about pt's appt. He states pt was seen at [facility] hospital this past weekend and doesn't think she needs lab work again because she had it drawn there. He asked if someone can look at the lab work there and let them if Philis Nettle, NP still wants to see pt.

## 2022-12-22 ENCOUNTER — Ambulatory Visit: Payer: Medicare Other | Admitting: Nurse Practitioner

## 2022-12-30 ENCOUNTER — Telehealth: Payer: Self-pay | Admitting: Nurse Practitioner

## 2022-12-30 MED ORDER — LOSARTAN POTASSIUM 25 MG PO TABS: 75.0000 mg | ORAL_TABLET | Freq: Every day | ORAL | 3 refills | Status: DC

## 2022-12-30 MED ORDER — TORSEMIDE 20 MG PO TABS: 20.0000 mg | ORAL_TABLET | Freq: Every day | ORAL | 3 refills | Status: DC

## 2022-12-30 NOTE — Telephone Encounter (Signed)
     1. Which medications need to be refilled? (please list name of each medication and dose if known) losartan (COZAAR) 25 MG tablet  patient takes 3 pills daily   Torsemide 10 mg    2. Would you like to learn more about the convenience, safety, & potential cost savings by using the [facility]? maybe     3. Are you open to using the [facility] (Type [facility]. maybe).   4. Which pharmacy/location (including street and city if local pharmacy) is medication to be sent to? [facility] [place]   5. Do they need a 30 day or 90 day supply? 90

## 2022-12-30 NOTE — Telephone Encounter (Signed)
Pt's medications were sent to pt's pharmacy as requested. Confirmation received.  

## 2023-01-22 ENCOUNTER — Encounter: Payer: Self-pay | Admitting: Nurse Practitioner

## 2023-01-22 ENCOUNTER — Ambulatory Visit: Payer: Medicare Other | Attending: Nurse Practitioner | Admitting: Nurse Practitioner

## 2023-01-22 VITALS — BP 118/70 | HR 89 | Ht 64.0 in | Wt 170.0 lb

## 2023-01-22 DIAGNOSIS — M542 Cervicalgia: Secondary | ICD-10-CM

## 2023-01-22 DIAGNOSIS — I5032 Chronic diastolic (congestive) heart failure: Secondary | ICD-10-CM | POA: Diagnosis not present

## 2023-01-22 DIAGNOSIS — Z01810 Encounter for preprocedural cardiovascular examination: Secondary | ICD-10-CM

## 2023-01-22 DIAGNOSIS — I2510 Atherosclerotic heart disease of native coronary artery without angina pectoris: Secondary | ICD-10-CM

## 2023-01-22 DIAGNOSIS — I428 Other cardiomyopathies: Secondary | ICD-10-CM | POA: Diagnosis not present

## 2023-01-22 DIAGNOSIS — R600 Localized edema: Secondary | ICD-10-CM

## 2023-01-22 NOTE — Progress Notes (Signed)
Cardiology Office Note:  .   Date: 01/22/2023 ID:  Helen Mitchell, DOB Jul 24, 1955, MRN [id number] PCP: Joaquin Courts, DO  [facility] Providers Cardiologist:  Donato Schultz, MD    History of Present Illness: Marland Kitchen   Helen Mitchell is a 67 y.o. female with a PMH of minimal CAD, NSTEMI, chest discomfort, HTN, alcohol-related CM, prior alcohol withdrawal, who presents today for scheduled follow-up.   Last seen by Dr. Anne Fu on November 13, 2021. She noted having chest discomfort. Coronary CT was ordered that revealed minimal non-obstructive CAD with coronary calcium score of 176. Compression fractures were noted along thoracic vertebrae along with aortic atherosclerosis, and mild atelectasis.   11/10/2022 - Today patient presents with her caregiver, Lyda Jester who acts as her historian.  Patient is a poor historian as she is very fatigued while I am taking her history, appears to be closing her eyes / dozing off a few times as I am speaking with her.  Patient saw her PCP on May 23 this year and appears she received a vaccine and blood work and was sick the following few days.  She was sent to [facility] and was admitted from May 27 to May 31.  Was then sent to [facility] for rehab from May 31 to June 13.  Today when asking patient how she is doing, states she has not been doing well.  Chief concern is her feet have been hurting due to leg swelling.  States torsemide has not been helping.  Lyda Jester says while at [facility] she was treated for CHF.  Lyda Jester also says blood pressure was elevated (238 systolic) during hospitalization at one point. She also admits to neck and back pain.  Says she broke her neck about 14 years ago, and due to this, she says she "cannot do anything."  She does admit to intermittent chest pain that is triggered by stress, difficult for her to describe to me.  When asking her where this is located she states it will happen around "her hip and will go all the  way down her legs."  Then when I asked her to point to where her chest pain is located she points along her left side of her chest, says it's nonradiating, describes it as a 10/10.  Prior to entering office room for visit, CMA notified me patient was requesting a refill for her Percocet and a prescription for Xanax. Pt admits to chronic, stable DOE. Denies any palpitations, syncope, presyncope, dizziness, orthopnea, PND, acute bleeding, or claudication.  [place] ED visit on 01/15/2023 after a fall, X-ray of left lower leg revealed bimalleolar fractures at leel of syndemosis, bimalleolar soft tissue swelling and ankle joint effusion noted. Splint applied. Was instructed to be NWB, and to continue Aspirin 81 mg daily until date of surgery for VTE prophylaxis. Care precautions were discussed.   01/22/2023 - Patient is doing much better from when I last saw her. She is coherent, A&O x 4, and appears better from when I last saw her from a Neuro standpoint.  From a cardiac standpoint, she is doing well. Denies any chest pain, shortness of breath, palpitations, syncope, presyncope, dizziness, orthopnea, PND, significant weight changes, acute bleeding, or claudication. Does note significant swelling to LLE related to her injury. She elevates her leg PRN and has been following instructions from [facility] clinic.  Lyda Jester her caregiver says that she will be having surgery on her left lower extremity on January 30, 2023.  Later on this month,  after this surgery, says she will be going for injections along her neck due to chronic orthopedic issues.  Studies Reviewed: Marland Kitchen    CCTA 11/2021: IMPRESSION: 1.  Minimal nonobstructive CAD, CADRADS = 1.   2. Coronary calcium score of 176. This was 83rd percentile for age and sex matched control.   3. Normal coronary origin with right dominance.   INTERPRETATION:   CAD-RADS 1: Minimal non-obstructive CAD (1-24%). Consider non-atherosclerotic causes of chest pain.  Consider preventive therapy and risk factor modification.  Echo 03/2021:   1. Left ventricular ejection fraction, by estimation, is 60 to 65%. The  left ventricle has normal function. The left ventricle has no regional  wall motion abnormalities. There is mild left ventricular hypertrophy.  Left ventricular diastolic parameters  were normal.   2. Right ventricular systolic function is normal. The right ventricular  size is normal. There is moderately elevated pulmonary artery systolic  pressure. The estimated right ventricular systolic pressure is 45.5 mmHg.   3. Left atrial size was upper normal.   4. The mitral valve is grossly normal. Mild mitral valve regurgitation.   5. The aortic valve was not well visualized. Aortic valve regurgitation  is not visualized.   6. The inferior vena cava is normal in size with greater than 50%  respiratory variability, suggesting right atrial pressure of 3 mmHg.   Comparison(s): Prior images reviewed side by side. LVEF has normalized.  Lexiscan 09/2020: There was no ST segment deviation noted during stress. The study is normal. There are no perfusion defects consistent with prior infarct or current ischemia. This is a low risk study. The left ventricular ejection fraction is normal (55-65%).  Physical Exam:   VS:  BP 118/70   Pulse 89   Ht 5\' 4"  (1.626 m)   Wt 170 lb (77.1 kg)   SpO2 95%   BMI 29.18 kg/m    Wt Readings from Last 3 Encounters:  01/22/23 170 lb (77.1 kg)  11/10/22 170 lb (77.1 kg)  05/15/22 160 lb (72.6 kg)    GEN: Well nourished, well developed in no acute distress NECK: No JVD; No carotid bruits CARDIAC: S1/S2, RRR, no murmurs, rubs, gallops RESPIRATORY:  Clear to auscultation without rales, wheezing or rhonchi  EXTREMITIES:  UTA LLE as this is wrapped, but noted to have 3+ edema to leg from knee down to her toes. Brisk, capillary refill to her left toes. Trace, nonpitting edema to right lower extremity, with what appears  to be chronic venous insufficiency on exam, 2+ pedal pulse to right lower extremity.  2+ radial pulses.  She has chronic, bilateral tremors to her hands.   ASSESSMENT AND PLAN: .    HFpEF, NICM, leg edema Stage C, NYHA class I-II symptoms. Hx of alcohol-related CM. Last Echo on file revealed EF 60-65%. Weight is stable, but admits LLE from past injury.  Requested to get lab work, however Lyda Jester says she will be getting labs at [facility].  Will request these to be faxed to our office when these are available.  I also mention my main concern is to make sure there is no DVT of her left lower extremity due to evidence of significant leg swelling.  Advised her to mention this to her care team at [facility] clinic tomorrow.  He verbalized understanding.  Will route my note to Dr. Majel Homer of [facility] who will be performing surgery next Friday, recommend a lower extremity Doppler is obtained to rule out DVT. Continue current  medication regimen.  Low sodium diet, fluid restriction <2L, and daily weights encouraged. Educated to contact our office for weight gain of 2 lbs overnight or 5 lbs in one week.   Minimal CAD Denies any chest pain. CCTA 11/2021 revealed minimal, non-obstructive CAD and to consider nonatherosclerotic causes of chest pain. Continue aspirin, atorvastatin, Toprol XL, and NTG PRN. ED precautions discussed.  3. Pre-operative cardiovascular risk assessment  Ms. Moritz's perioperative risk of a major cardiac event is 6.6% according to the Revised Cardiac Risk Index (RCRI).  Therefore, she is at high risk for perioperative complications.   Her functional capacity is poor at 3.63 METs according to the Duke Activity Status Index (DASI).  Her activity level is limited due to injury of left lower extremity. Recommendations: The patient is at high risk for perioperative cardiac complications and is at a low functional capacity.  However, further testing will not change how her cardiac  status is managed.  Proceed with surgery at high risk if there are no other options for treatment. Antiplatelet and/or Anticoagulation Recommendations: The patient should remain on Aspirin without interruption unless surgeon feels bleeding risk is too high.  If bleeding risk is too high, okay to hold aspirin for 7 days prior to procedure.  Patient is not on any anticoagulation therapy. Will route note to requesting party.   4. Neck pain Caregiver Lyda Jester is requesting a carotid Doppler to be performed.  This does not need to be done urgently.  No bruit noted on exam.  Patient denies any concerning symptoms. I recommended this is considered for future office visit as she will be having neck injection in the near future.  For neck injections, we recommend that aspirin is held for 7 days prior to procedure due to high risk of bleeding.   Dispo: Follow-up with me or APP in 2-3 months or sooner if anything changes.   Signed, Sharlene Dory, NP

## 2023-01-22 NOTE — Patient Instructions (Signed)
Medication Instructions:  Your physician recommends that you continue on your current medications as directed. Please refer to the Current Medication list given to you today.  Labwork: None  Testing/Procedures: None  Follow-Up: Your physician recommends that you schedule a follow-up appointment in: 2-3 Months   Any Other Special Instructions Will Be Listed Below (If Applicable).  Recommend [facility] to do a Lower extremity Doppler study   If you need a refill on your cardiac medications before your next appointment, please call your pharmacy.

## 2023-01-27 ENCOUNTER — Telehealth: Payer: Self-pay | Admitting: Nurse Practitioner

## 2023-01-27 DIAGNOSIS — I2510 Atherosclerotic heart disease of native coronary artery without angina pectoris: Secondary | ICD-10-CM

## 2023-01-27 DIAGNOSIS — I824Z2 Acute embolism and thrombosis of unspecified deep veins of left distal lower extremity: Secondary | ICD-10-CM

## 2023-01-27 NOTE — Telephone Encounter (Signed)
Test has been ordered to have STAT lower extremity doppler done before patients surgery this Friday per elizabeth peck

## 2023-01-27 NOTE — Telephone Encounter (Signed)
-----   Message from Sharlene Dory sent at 01/26/2023  5:01 PM EDT ----- Please ask Lyda Jester (patient's caregiver) if dopplers will be done of her left lower extremity prior to her surgery this upcoming Friday. I faxed my note to surgeon with my recommendations. If not, I recommend arranging a STAT lower extremity venous doppler of left leg to r/o DVT.   Thanks!   Best, Sharlene Dory, NP

## 2023-01-28 ENCOUNTER — Ambulatory Visit: Payer: Medicare Other | Attending: Nurse Practitioner

## 2023-01-28 DIAGNOSIS — I2510 Atherosclerotic heart disease of native coronary artery without angina pectoris: Secondary | ICD-10-CM

## 2023-01-28 DIAGNOSIS — I824Z2 Acute embolism and thrombosis of unspecified deep veins of left distal lower extremity: Secondary | ICD-10-CM

## 2023-03-31 ENCOUNTER — Encounter: Payer: Self-pay | Admitting: Nurse Practitioner

## 2023-03-31 ENCOUNTER — Ambulatory Visit: Payer: Medicare Other | Attending: Nurse Practitioner | Admitting: Nurse Practitioner

## 2023-03-31 VITALS — BP 110/60 | HR 72 | Ht 64.0 in | Wt 181.2 lb

## 2023-03-31 DIAGNOSIS — I428 Other cardiomyopathies: Secondary | ICD-10-CM | POA: Diagnosis not present

## 2023-03-31 DIAGNOSIS — I5032 Chronic diastolic (congestive) heart failure: Secondary | ICD-10-CM | POA: Diagnosis not present

## 2023-03-31 DIAGNOSIS — Z79899 Other long term (current) drug therapy: Secondary | ICD-10-CM

## 2023-03-31 DIAGNOSIS — R600 Localized edema: Secondary | ICD-10-CM

## 2023-03-31 DIAGNOSIS — I2510 Atherosclerotic heart disease of native coronary artery without angina pectoris: Secondary | ICD-10-CM

## 2023-03-31 DIAGNOSIS — M542 Cervicalgia: Secondary | ICD-10-CM

## 2023-03-31 NOTE — Progress Notes (Unsigned)
Cardiology Office Note:  .   Date: 03/31/2023 ID:  Helen Mitchell, DOB 1956-02-25, MRN [id number] PCP: Joaquin Courts, DO  [facility] Providers Cardiologist:  Donato Schultz, MD    History of Present Illness: Marland Kitchen   Helen Mitchell is a 67 y.o. female with a PMH of minimal CAD, NSTEMI, chest discomfort, HTN, alcohol-related CM, prior alcohol withdrawal, who presents today for scheduled follow-up.   Last seen by Dr. Anne Fu on November 13, 2021. She noted having chest discomfort. Coronary CT was ordered that revealed minimal non-obstructive CAD with coronary calcium score of 176. Compression fractures were noted along thoracic vertebrae along with aortic atherosclerosis, and mild atelectasis.   11/10/2022 - Today patient presents with her caregiver, Helen Mitchell who acts as her historian.  Patient is a poor historian as she is very fatigued while I am taking her history, appears to be closing her eyes / dozing off a few times as I am speaking with her.  Patient saw her PCP on May 23 this year and appears she received a vaccine and blood work and was sick the following few days.  She was sent to [facility] and was admitted from May 27 to May 31.  Was then sent to [facility] for rehab from May 31 to June 13.  Today when asking patient how she is doing, states she has not been doing well.  Chief concern is her feet have been hurting due to leg swelling.  States torsemide has not been helping.  Helen Mitchell says while at [facility] she was treated for CHF.  Helen Mitchell also says blood pressure was elevated (238 systolic) during hospitalization at one point. She also admits to neck and back pain.  Says she broke her neck about 14 years ago, and due to this, she says she "cannot do anything."  She does admit to intermittent chest pain that is triggered by stress, difficult for her to describe to me.  When asking her where this is located she states it will happen around "her hip and will go all the  way down her legs."  Then when I asked her to point to where her chest pain is located she points along her left side of her chest, says it's nonradiating, describes it as a 10/10.  Prior to entering office room for visit, CMA notified me patient was requesting a refill for her Percocet and a prescription for Xanax. Pt admits to chronic, stable DOE. Denies any palpitations, syncope, presyncope, dizziness, orthopnea, PND, acute bleeding, or claudication.  [place] ED visit on 01/15/2023 after a fall, X-ray of left lower leg revealed bimalleolar fractures at leel of syndemosis, bimalleolar soft tissue swelling and ankle joint effusion noted. Splint applied. Was instructed to be NWB, and to continue Aspirin 81 mg daily until date of surgery for VTE prophylaxis. Care precautions were discussed.   01/22/2023 - Patient is doing much better from when I last saw her. She is coherent, A&O x 4, and appears better from when I last saw her from a Neuro standpoint.  From a cardiac standpoint, she is doing well. Denies any chest pain, shortness of breath, palpitations, syncope, presyncope, dizziness, orthopnea, PND, significant weight changes, acute bleeding, or claudication. Does note significant swelling to LLE related to her injury. She elevates her leg PRN and has been following instructions from [facility] clinic.  Helen Mitchell her caregiver says that she will be having surgery on her left lower extremity on January 30, 2023.  Later on this month,  after this surgery, says she will be going for injections along her neck due to chronic orthopedic issues.  03/31/2023 -she presents today for follow-up with her caregiver.  Admits to chronic chest pain, described as left upper chest, Helen Mitchell says that stress brings on her symptoms.  She says relaxing/taking deep breaths helps alleviate her symptoms, Helen Mitchell says that symptoms can last for days.  Difficult for patient to describe this completely as she then starts to describe her  chronic neck/upper back pain.  Patient says she lives in chronic pain, since I last saw her she received a Botox injection for chronic pain, says this is wearing out and not working as well as it initially did.  Has been having neuropathic pain for the past 2 years.  Denies any shortness of breath, palpitations, syncope, presyncope, dizziness, orthopnea, PND, significant weight changes, acute bleeding, or claudication.  Studies Reviewed: Marland Kitchen    CCTA 11/2021: IMPRESSION: 1.  Minimal nonobstructive CAD, CADRADS = 1.   2. Coronary calcium score of 176. This was 83rd percentile for age and sex matched control.   3. Normal coronary origin with right dominance.   INTERPRETATION:   CAD-RADS 1: Minimal non-obstructive CAD (1-24%). Consider non-atherosclerotic causes of chest pain. Consider preventive therapy and risk factor modification.  Echo 03/2021:   1. Left ventricular ejection fraction, by estimation, is 60 to 65%. The  left ventricle has normal function. The left ventricle has no regional  wall motion abnormalities. There is mild left ventricular hypertrophy.  Left ventricular diastolic parameters  were normal.   2. Right ventricular systolic function is normal. The right ventricular  size is normal. There is moderately elevated pulmonary artery systolic  pressure. The estimated right ventricular systolic pressure is 45.5 mmHg.   3. Left atrial size was upper normal.   4. The mitral valve is grossly normal. Mild mitral valve regurgitation.   5. The aortic valve was not well visualized. Aortic valve regurgitation  is not visualized.   6. The inferior vena cava is normal in size with greater than 50%  respiratory variability, suggesting right atrial pressure of 3 mmHg.   Comparison(s): Prior images reviewed side by side. LVEF has normalized.  Lexiscan 09/2020: There was no ST segment deviation noted during stress. The study is normal. There are no perfusion defects consistent with  prior infarct or current ischemia. This is a low risk study. The left ventricular ejection fraction is normal (55-65%).  Physical Exam:   VS:  BP 110/60 (BP Location: Left Arm, Patient Position: Sitting, Cuff Size: Normal)   Pulse 72   Ht 5\' 4"  (1.626 m)   Wt 181 lb 3.2 oz (82.2 kg)   SpO2 91%   BMI 31.10 kg/m    Wt Readings from Last 3 Encounters:  03/31/23 181 lb 3.2 oz (82.2 kg)  01/22/23 170 lb (77.1 kg)  11/10/22 170 lb (77.1 kg)    GEN: Well nourished, well developed in no acute distress NECK: No JVD; No carotid bruits CARDIAC: S1/S2, RRR, no murmurs, rubs, gallops RESPIRATORY:  Clear to auscultation without rales, wheezing or rhonchi  EXTREMITIES: Nonpitting edema to left lower extremity, improved from last visit. Nonpitting BLE edema equal on exam.   ASSESSMENT AND PLAN: .    HFpEF, NICM, leg edema Stage C, NYHA class I-II symptoms. Hx of alcohol-related CM. Last Echo on file revealed EF 60-65%.  Leg swelling has improved since I last saw her in the office. No DVT seen on past doppler. Instructed her  to increase torsemide to 40 mg daily for the next 3 days, then reduce to torsemide 20 mg daily.  In 1 week, will obtain BMET, magnesium, proBNP.  Continue rest of medication regimen.  Low sodium diet, fluid restriction <2L, and daily weights encouraged. Educated to contact our office for weight gain of 2 lbs overnight or 5 lbs in one week.   Minimal CAD Admits to chronic chest pain, atypical and seems related to her history of general chronic pain, as well as neuropathic pain. CCTA 11/2021 revealed minimal, non-obstructive CAD and to consider nonatherosclerotic causes of chest pain.  No indication for ischemic evaluation at this time.  Continue aspirin, atorvastatin, Toprol XL, and NTG PRN. ED precautions discussed.  4. Neck pain Chronic, ongoing. Continue to follow-up with PCP and rest of care team.   Dispo: Care and ED precautions discussed. Follow-up with me or APP in 2-3  months or sooner if anything changes.   Signed, Sharlene Dory, NP

## 2023-03-31 NOTE — Patient Instructions (Addendum)
Medication Instructions:  Your physician has recommended you make the following change in your medication:  Take torsemide 40 mg daily in the morning for 3 days, then resume 20 mg daily Continue all other medications as prescribed  Labwork: BMET, BNP & Magnesium Level in one week at [facility] (04/07/2023) Non-fasting  Testing/Procedures: none  Follow-Up: Your physician recommends that you schedule a follow-up appointment in: 2-3 months  Any Other Special Instructions Will Be Listed Below (If Applicable).  If you need a refill on your cardiac medications before your next appointment, please call your pharmacy.

## 2023-06-15 ENCOUNTER — Ambulatory Visit: Payer: Medicare Other | Attending: Nurse Practitioner | Admitting: Nurse Practitioner

## 2023-06-15 ENCOUNTER — Encounter: Payer: Self-pay | Admitting: Nurse Practitioner

## 2023-06-15 VITALS — BP 106/60 | HR 72 | Ht 64.0 in | Wt 190.0 lb

## 2023-06-15 DIAGNOSIS — I428 Other cardiomyopathies: Secondary | ICD-10-CM | POA: Diagnosis not present

## 2023-06-15 DIAGNOSIS — R600 Localized edema: Secondary | ICD-10-CM | POA: Diagnosis not present

## 2023-06-15 DIAGNOSIS — G8929 Other chronic pain: Secondary | ICD-10-CM

## 2023-06-15 DIAGNOSIS — I5033 Acute on chronic diastolic (congestive) heart failure: Secondary | ICD-10-CM | POA: Diagnosis not present

## 2023-06-15 DIAGNOSIS — I2510 Atherosclerotic heart disease of native coronary artery without angina pectoris: Secondary | ICD-10-CM

## 2023-06-15 DIAGNOSIS — M542 Cervicalgia: Secondary | ICD-10-CM

## 2023-06-15 DIAGNOSIS — Z79899 Other long term (current) drug therapy: Secondary | ICD-10-CM

## 2023-06-15 DIAGNOSIS — R079 Chest pain, unspecified: Secondary | ICD-10-CM

## 2023-06-15 NOTE — Progress Notes (Signed)
 Cardiology Office Note:  .   Date: 06/15/2023 ID:  Helen Mitchell, DOB Oct 17, 1955, MRN [id number] PCP: Joaquin Courts, DO  [facility] Providers Cardiologist:  Donato Schultz, MD    History of Present Illness: Helen Mitchell Kitchen   Helen Mitchell is a 68 y.o. female with a PMH of minimal CAD, NSTEMI, chest discomfort, HTN, alcohol-related CM, prior alcohol withdrawal, who presents today for scheduled follow-up.   Last seen by Dr. Anne Fu on November 13, 2021. She noted having chest discomfort. Coronary CT was ordered that revealed minimal non-obstructive CAD with coronary calcium score of 176. Compression fractures were noted along thoracic vertebrae along with aortic atherosclerosis, and mild atelectasis.   11/10/2022 - Today patient presents with her caregiver, Helen Mitchell who acts as her historian.  Patient is a poor historian as she is very fatigued while I am taking her history, appears to be closing her eyes / dozing off a few times as I am speaking with her.  Patient saw her PCP on May 23 this year and appears she received a vaccine and blood work and was sick the following few days.  She was sent to [facility] and was admitted from May 27 to May 31.  Was then sent to [facility] for rehab from May 31 to June 13.  Today when asking patient how she is doing, states she has not been doing well.  Chief concern is her feet have been hurting due to leg swelling.  States torsemide has not been helping.  Helen Mitchell says while at [facility] she was treated for CHF.  Helen Mitchell also says blood pressure was elevated (238 systolic) during hospitalization at one point. She also admits to neck and back pain.  Says she broke her neck about 14 years ago, and due to this, she says she "cannot do anything."  She does admit to intermittent chest pain that is triggered by stress, difficult for her to describe to me.  When asking her where this is located she states it will happen around "her hip and will go all the way  down her legs."  Then when I asked her to point to where her chest pain is located she points along her left side of her chest, says it's nonradiating, describes it as a 10/10.  Prior to entering office room for visit, CMA notified me patient was requesting a refill for her Percocet and a prescription for Xanax. Pt admits to chronic, stable DOE. Denies any palpitations, syncope, presyncope, dizziness, orthopnea, PND, acute bleeding, or claudication.  [place] ED visit on 01/15/2023 after a fall, X-ray of left lower leg revealed bimalleolar fractures at leel of syndemosis, bimalleolar soft tissue swelling and ankle joint effusion noted. Splint applied. Was instructed to be NWB, and to continue Aspirin 81 mg daily until date of surgery for VTE prophylaxis. Care precautions were discussed.   01/22/2023 - Patient is doing much better from when I last saw her. She is coherent, A&O x 4, and appears better from when I last saw her from a Neuro standpoint.  From a cardiac standpoint, she is doing well. Denies any chest pain, shortness of breath, palpitations, syncope, presyncope, dizziness, orthopnea, PND, significant weight changes, acute bleeding, or claudication. Does note significant swelling to LLE related to her injury. She elevates her leg PRN and has been following instructions from [facility] clinic.  Helen Mitchell her caregiver says that she will be having surgery on her left lower extremity on January 30, 2023.  Later on this month,  after this surgery, says she will be going for injections along her neck due to chronic orthopedic issues.  03/31/2023 -she presents today for follow-up with her caregiver.  Admits to chronic chest pain, described as left upper chest, Helen Mitchell says that stress brings on her symptoms.  She says relaxing/taking deep breaths helps alleviate her symptoms, Helen Mitchell says that symptoms can last for days.  Difficult for patient to describe this completely as she then starts to describe her  chronic neck/upper back pain.  Patient says she lives in chronic pain, since I last saw her she received a Botox injection for chronic pain, says this is wearing out and not working as well as it initially did.  Has been having neuropathic pain for the past 2 years.  Denies any shortness of breath, palpitations, syncope, presyncope, dizziness, orthopnea, PND, significant weight changes, acute bleeding, or claudication.  06/15/2023 -presents today for follow-up with her caregiver.  Chief complaint of feeling weak.  Admits to recent increase shortness of breath, leg swelling, and also admits to weight gain. Denies any palpitations, syncope, presyncope, dizziness, orthopnea, PND,  acute bleeding, or claudication.  Continues to admit to chronic/stable atypical chest pain-see above.   SH: Negative. See HPI.   Studies Reviewed: Helen Mitchell Kitchen    CCTA 11/2021: IMPRESSION: 1.  Minimal nonobstructive CAD, CADRADS = 1.   2. Coronary calcium score of 176. This was 83rd percentile for age and sex matched control.   3. Normal coronary origin with right dominance.   INTERPRETATION:   CAD-RADS 1: Minimal non-obstructive CAD (1-24%). Consider non-atherosclerotic causes of chest pain. Consider preventive therapy and risk factor modification.  Echo 03/2021:   1. Left ventricular ejection fraction, by estimation, is 60 to 65%. The  left ventricle has normal function. The left ventricle has no regional  wall motion abnormalities. There is mild left ventricular hypertrophy.  Left ventricular diastolic parameters  were normal.   2. Right ventricular systolic function is normal. The right ventricular  size is normal. There is moderately elevated pulmonary artery systolic  pressure. The estimated right ventricular systolic pressure is 45.5 mmHg.   3. Left atrial size was upper normal.   4. The mitral valve is grossly normal. Mild mitral valve regurgitation.   5. The aortic valve was not well visualized. Aortic valve  regurgitation  is not visualized.   6. The inferior vena cava is normal in size with greater than 50%  respiratory variability, suggesting right atrial pressure of 3 mmHg.   Comparison(s): Prior images reviewed side by side. LVEF has normalized.  Lexiscan 09/2020: There was no ST segment deviation noted during stress. The study is normal. There are no perfusion defects consistent with prior infarct or current ischemia. This is a low risk study. The left ventricular ejection fraction is normal (55-65%).  Physical Exam:   VS:  BP 106/60   Pulse 72   Ht 5\' 4"  (1.626 m)   Wt 190 lb (86.2 kg)   SpO2 94%   BMI 32.61 kg/m    Wt Readings from Last 3 Encounters:  06/15/23 190 lb (86.2 kg)  03/31/23 181 lb 3.2 oz (82.2 kg)  01/22/23 170 lb (77.1 kg)    GEN: Obese, 68 year old female in no acute distress, appears fatigued NECK: No JVD; No carotid bruits CARDIAC: S1/S2, RRR, no murmurs, rubs, gallops RESPIRATORY:  Clear and diminished to auscultation without rales, wheezing or rhonchi  EXTREMITIES: Nonpitting edema to left lower extremity (L>R) .   ASSESSMENT AND PLAN: .  Acute on chronic HFpEF, NICM, leg edema, medication management Stage C, NYHA class I-II symptoms. Hx of alcohol-related CM. Last Echo on file revealed EF 60-65%.  No DVT seen on past doppler. Will obtain BMET in 1 week.  Continue current medication regimen.  If kidney function permits, plan to increase torsemide permanently.  Low sodium diet, fluid restriction <2L, and daily weights encouraged. Educated to contact our office for weight gain of 2 lbs overnight or 5 lbs in one week.   Minimal CAD, chronic chest pain Admits to stable, chronic chest pain, atypical and seems related to her history of general chronic pain, as well as neuropathic pain. CCTA 11/2021 revealed minimal, non-obstructive CAD and to consider nonatherosclerotic causes of chest pain.  No indication for ischemic evaluation at this time.  Continue  current  medication regimen. Care and ED precautions discussed.  4. Neck pain Chronic, ongoing. Continue to follow-up with PCP and rest of care team.   Dispo: Care and ED precautions discussed. Follow-up with me or APP in 4-6 weeks or sooner if anything changes.   Signed, Sharlene Dory, NP

## 2023-06-15 NOTE — Patient Instructions (Signed)
 Medication Instructions:  Your physician recommends that you continue on your current medications as directed. Please refer to the Current Medication list given to you today.  Lab Work  Today at [facility]  Testing/Procedures: None   Follow-Up: Your physician recommends that you schedule a follow-up appointment in: 4-6 weeks    Any Other Special Instructions Will Be Listed Below (If Applicable).  If you need a refill on your cardiac medications before your next appointment, please call your pharmacy.

## 2023-06-23 ENCOUNTER — Telehealth: Payer: Self-pay | Admitting: Nurse Practitioner

## 2023-06-23 DIAGNOSIS — I5033 Acute on chronic diastolic (congestive) heart failure: Secondary | ICD-10-CM

## 2023-06-23 DIAGNOSIS — Z79899 Other long term (current) drug therapy: Secondary | ICD-10-CM

## 2023-06-23 MED ORDER — TORSEMIDE 20 MG PO TABS: 40.0000 mg | ORAL_TABLET | Freq: Every day | ORAL | 3 refills | Status: DC

## 2023-06-23 NOTE — Telephone Encounter (Signed)
 Kidney function looks good. Please increase Torsemide to 40 mg daily. Please repeat BMET in 2 weeks for acute on chronic heart failure with preserved ejection fraction.   Thanks!   Best,  Sharlene Dory, NP   Caregiver informed and verbalized understanding. Mailed lab orders out to be completed in 2 weeks and sent in increased Torsemide 40 mg daily. Patient will take 2 tablets of the 20 mg until she runs out.

## 2023-06-23 NOTE — Telephone Encounter (Signed)
 Pt is requesting a callback regarding results. Please advise

## 2023-06-23 NOTE — Telephone Encounter (Signed)
 Follow Up:    Patient's husband called back, he said they have to go out. Please call her on her cell phone.

## 2023-07-07 ENCOUNTER — Telehealth: Payer: Self-pay | Admitting: Nurse Practitioner

## 2023-07-07 NOTE — Telephone Encounter (Signed)
 States pt has been in the hospital and has labs drawn while there. He wants to know if those labs will be ok or does she still need to have labs drawn. Please advise

## 2023-07-07 NOTE — Telephone Encounter (Signed)
 Notified caregiver [facility]) that she will still need to get the BMET done - did not appear that lab had been.  States he will try to get her there next week.

## 2023-07-27 ENCOUNTER — Emergency Department (HOSPITAL_COMMUNITY)

## 2023-07-27 ENCOUNTER — Emergency Department (HOSPITAL_COMMUNITY)
Admission: EM | Admit: 2023-07-27 | Discharge: 2023-07-27 | Disposition: A | Discharge status: Home / Self Care | Attending: Emergency Medicine | Admitting: Emergency Medicine

## 2023-07-27 ENCOUNTER — Other Ambulatory Visit: Payer: Self-pay

## 2023-07-27 ENCOUNTER — Ambulatory Visit: Attending: Nurse Practitioner | Admitting: Nurse Practitioner

## 2023-07-27 ENCOUNTER — Encounter (HOSPITAL_COMMUNITY): Payer: Self-pay

## 2023-07-27 ENCOUNTER — Encounter: Payer: Self-pay | Admitting: Nurse Practitioner

## 2023-07-27 VITALS — BP 110/70 | HR 77 | Ht 64.0 in | Wt 195.0 lb

## 2023-07-27 DIAGNOSIS — I428 Other cardiomyopathies: Secondary | ICD-10-CM | POA: Diagnosis not present

## 2023-07-27 DIAGNOSIS — T50901A Poisoning by unspecified drugs, medicaments and biological substances, accidental (unintentional), initial encounter: Secondary | ICD-10-CM

## 2023-07-27 DIAGNOSIS — I2510 Atherosclerotic heart disease of native coronary artery without angina pectoris: Secondary | ICD-10-CM | POA: Diagnosis not present

## 2023-07-27 DIAGNOSIS — G8929 Other chronic pain: Secondary | ICD-10-CM

## 2023-07-27 DIAGNOSIS — R600 Localized edema: Secondary | ICD-10-CM | POA: Diagnosis present

## 2023-07-27 DIAGNOSIS — I5033 Acute on chronic diastolic (congestive) heart failure: Secondary | ICD-10-CM | POA: Diagnosis not present

## 2023-07-27 DIAGNOSIS — N179 Acute kidney failure, unspecified: Secondary | ICD-10-CM

## 2023-07-27 DIAGNOSIS — L03119 Cellulitis of unspecified part of limb: Secondary | ICD-10-CM

## 2023-07-27 DIAGNOSIS — I509 Heart failure, unspecified: Secondary | ICD-10-CM | POA: Insufficient documentation

## 2023-07-27 DIAGNOSIS — R079 Chest pain, unspecified: Secondary | ICD-10-CM

## 2023-07-27 LAB — URINALYSIS, W/ REFLEX TO CULTURE (INFECTION SUSPECTED)
Bacteria, UA: NONE SEEN
Bilirubin Urine: NEGATIVE
Glucose, UA: NEGATIVE mg/dL
Hgb urine dipstick: NEGATIVE
Ketones, ur: NEGATIVE mg/dL
Leukocytes,Ua: NEGATIVE
Nitrite: NEGATIVE
Protein, ur: NEGATIVE mg/dL
Specific Gravity, Urine: 1.004 — ABNORMAL LOW (ref 1.005–1.030)
pH: 7 (ref 5.0–8.0)

## 2023-07-27 LAB — RESP PANEL BY RT-PCR (RSV, FLU A&B, COVID)  RVPGX2
Influenza A by PCR: NEGATIVE
Influenza B by PCR: NEGATIVE
Resp Syncytial Virus by PCR: NEGATIVE
SARS Coronavirus 2 by RT PCR: NEGATIVE

## 2023-07-27 LAB — BRAIN NATRIURETIC PEPTIDE: B Natriuretic Peptide: 128 pg/mL — ABNORMAL HIGH (ref 0.0–100.0)

## 2023-07-27 LAB — CBC WITH DIFFERENTIAL/PLATELET
Abs Immature Granulocytes: 0.04 10*3/uL (ref 0.00–0.07)
Basophils Absolute: 0 10*3/uL (ref 0.0–0.1)
Basophils Relative: 1 %
Eosinophils Absolute: 0.2 10*3/uL (ref 0.0–0.5)
Eosinophils Relative: 2 %
HCT: 38.1 % (ref 36.0–46.0)
Hemoglobin: 11.9 g/dL — ABNORMAL LOW (ref 12.0–15.0)
Immature Granulocytes: 1 %
Lymphocytes Relative: 37 %
Lymphs Abs: 2.9 10*3/uL (ref 0.7–4.0)
MCH: 30.7 pg (ref 26.0–34.0)
MCHC: 31.2 g/dL (ref 30.0–36.0)
MCV: 98.4 fL (ref 80.0–100.0)
Monocytes Absolute: 0.8 10*3/uL (ref 0.1–1.0)
Monocytes Relative: 10 %
Neutro Abs: 3.9 10*3/uL (ref 1.7–7.7)
Neutrophils Relative %: 49 %
Platelets: 276 10*3/uL (ref 150–400)
RBC: 3.87 MIL/uL (ref 3.87–5.11)
RDW: 14.3 % (ref 11.5–15.5)
WBC: 7.9 10*3/uL (ref 4.0–10.5)
nRBC: 0 % (ref 0.0–0.2)

## 2023-07-27 LAB — BLOOD GAS, VENOUS
Acid-Base Excess: 11.2 mmol/L — ABNORMAL HIGH (ref 0.0–2.0)
Bicarbonate: 37.6 mmol/L — ABNORMAL HIGH (ref 20.0–28.0)
Drawn by: 69769
O2 Saturation: 45.5 %
Patient temperature: 36.6
pCO2, Ven: 57 mmHg (ref 44–60)
pH, Ven: 7.43 (ref 7.25–7.43)
pO2, Ven: <31 mmHg — CL (ref 32–45)

## 2023-07-27 LAB — LACTIC ACID, PLASMA
Lactic Acid, Venous: 2.7 mmol/L (ref 0.5–1.9)
Lactic Acid, Venous: 2.9 mmol/L (ref 0.5–1.9)

## 2023-07-27 LAB — BASIC METABOLIC PANEL WITH GFR
Anion gap: 15 (ref 5–15)
BUN: 15 mg/dL (ref 8–23)
CO2: 30 mmol/L (ref 22–32)
Calcium: 9.6 mg/dL (ref 8.9–10.3)
Chloride: 91 mmol/L — ABNORMAL LOW (ref 98–111)
Creatinine, Ser: 1.09 mg/dL — ABNORMAL HIGH (ref 0.44–1.00)
GFR, Estimated: 55 mL/min — ABNORMAL LOW (ref >60)
Glucose, Bld: 116 mg/dL — ABNORMAL HIGH (ref 70–99)
Potassium: 3.9 mmol/L (ref 3.5–5.1)
Sodium: 136 mmol/L (ref 135–145)

## 2023-07-27 LAB — PROTIME-INR
INR: 0.9 (ref 0.8–1.2)
Prothrombin Time: 12.7 s (ref 11.4–15.2)

## 2023-07-27 LAB — TROPONIN I (HIGH SENSITIVITY)
Troponin I (High Sensitivity): 7 ng/L (ref <18)
Troponin I (High Sensitivity): 6 ng/L (ref <18)

## 2023-07-27 MED ORDER — SODIUM CHLORIDE 0.9 % IV SOLN
500.0000 mg | Freq: Once | INTRAVENOUS | Status: AC
  Administered 2023-07-27: 500 mg via INTRAVENOUS
  Filled 2023-07-27: qty 5

## 2023-07-27 MED ORDER — FUROSEMIDE 10 MG/ML IJ SOLN
20.0000 mg | Freq: Once | INTRAMUSCULAR | Status: AC
  Administered 2023-07-27: 20 mg via INTRAVENOUS
  Filled 2023-07-27: qty 2

## 2023-07-27 MED ORDER — SODIUM CHLORIDE 0.9 % IV SOLN: 1.0000 g | Freq: Once | INTRAVENOUS | Status: DC

## 2023-07-27 MED ORDER — NOREPINEPHRINE 4 MG/250ML-% IV SOLN: 0.0000 ug/min | INTRAVENOUS | Status: DC

## 2023-07-27 MED ORDER — SODIUM CHLORIDE 0.9 % IV BOLUS
500.0000 mL | Freq: Once | INTRAVENOUS | Status: AC
  Administered 2023-07-27: 500 mL via INTRAVENOUS

## 2023-07-27 MED ORDER — SODIUM CHLORIDE 0.9 % IV SOLN
2.0000 g | Freq: Once | INTRAVENOUS | Status: AC
  Administered 2023-07-27: 2 g via INTRAVENOUS
  Filled 2023-07-27: qty 20

## 2023-07-27 MED ORDER — CEPHALEXIN 500 MG PO CAPS: 500.0000 mg | ORAL_CAPSULE | Freq: Three times a day (TID) | ORAL | 0 refills | Status: AC

## 2023-07-27 MED ORDER — NALOXONE HCL 4 MG/0.1ML NA LIQD
NASAL | Status: AC
  Administered 2023-07-27: 4 mg
  Filled 2023-07-27: qty 4

## 2023-07-27 NOTE — Sepsis Progress Note (Signed)
 Asking berdside RN if there is difficulty with sticking for labs and IV's

## 2023-07-27 NOTE — Sepsis Progress Note (Signed)
 Per bedside RN waiting on lab to stick pt(difficult stick)

## 2023-07-27 NOTE — ED Triage Notes (Signed)
 Pt sent by cardiologist for bilateral leg swelling with redness noted to lower legs. Pt very fatigue during triage. Upon getting triage vs BP noted to be 80s systolic and oxygen 88%.

## 2023-07-27 NOTE — Discharge Instructions (Addendum)
 Follow-up with your cardiologist for further management of congestive heart failure and lower extremity edema.

## 2023-07-27 NOTE — ED Provider Notes (Signed)
 [place] EMERGENCY DEPARTMENT AT [facility] Provider Note   CSN: [id number] Arrival date & time: 07/27/23  1614     History  No chief complaint on file.   Helen Mitchell is a 68 y.o. female.  Patient sent in by cardiologist for evaluation for lower extremity edema/chf exacerbation.  Patient states she was sent to the ED by her cardiologist for bilateral lower extremity swelling. She takes torsemide 20 mg daily but was told by her cardiologist today to increase it to 40 mg daily.  She admit to bilateral lower extremity swelling for the past year but states has been worse over the past week with worsening pain severity and some redness. She denies shortness of breath or chest pain. Admits to orthopnea.   On arrival to emergency department patient was hypotensive, hypoxic, and breathing at a rate of 12 breaths per minute.  She was lethargic, speaking with her eyes closed, and referring to herself in the third person. Patient states she takes percocets 10-325mg  4-5 times a day for the past ten years and that she took one just prior to arrival. Narcan was given with resolution of hypoxia, bradypnea, and hypotension.  The history is provided by the patient. No language interpreter was used.       Home Medications Prior to Admission medications   Medication Sig Start Date End Date Taking? Authorizing Provider  cephALEXin (KEFLEX) 500 MG capsule Take 1 capsule (500 mg total) by mouth 3 (three) times daily for 7 days. 07/27/23 08/03/23 Yes Edwin Dada P, DO  LINZESS 145 MCG CAPS capsule Take 145 mcg by mouth daily. 07/27/23  Yes [provider]  albuterol (VENTOLIN HFA) 108 (90 Base) MCG/ACT inhaler Inhale 2 puffs into the lungs every 4 (four) hours as needed. 02/06/23   [provider]  aspirin EC 81 MG tablet Take 81 mg by mouth daily. 01/14/23   [provider]  atorvastatin (LIPITOR) 40 MG tablet Take 40 mg by mouth daily.    [provider]   Biotin 1000 MCG tablet Take 1,000 mcg by mouth daily.    [provider]  DULoxetine (CYMBALTA) 60 MG capsule Take 60 mg by mouth 2 (two) times daily. 06/17/20   [provider]  losartan (COZAAR) 25 MG tablet Take 3 tablets (75 mg total) by mouth daily. 12/30/22   Sharlene Dory, NP  nitroGLYCERIN (NITROSTAT) 0.4 MG SL tablet Place 0.4 mg under the tongue every 5 (five) minutes x 3 doses as needed for chest pain (if no relief after 2nd dose, proceed to ED for an evaluation or call 911).    [provider]  oxyCODONE-acetaminophen (PERCOCET) 10-325 MG tablet Take 1 tablet by mouth 5 (five) times daily as needed for pain. 07/03/20   [provider]  pregabalin (LYRICA) 25 MG capsule Take 25 mg by mouth 2 (two) times daily.    [provider]  torsemide (DEMADEX) 20 MG tablet Take 2 tablets (40 mg total) by mouth daily. 06/23/23   Sharlene Dory, NP  traZODone (DESYREL) 100 MG tablet Take 100 mg by mouth at bedtime. 12/27/20   [provider]  TYMLOS 3120 MCG/1.56ML SOPN Inject into the skin.    [provider]      Allergies    Patient has no known allergies.    Review of Systems   Review of Systems  Constitutional:  Negative for chills and fever.  HENT:  Negative for ear pain and sore throat.   Eyes:  Negative for pain and visual disturbance.  Respiratory:  Negative for cough.   Cardiovascular:  Positive for leg swelling. Negative for chest pain and palpitations.  Gastrointestinal:  Negative for abdominal pain and vomiting.  Genitourinary:  Negative for dysuria and hematuria.  Musculoskeletal:  Negative for arthralgias and back pain.  Skin:  Negative for color change and rash.  Neurological:  Negative for seizures and syncope.  All other systems reviewed and are negative.   Physical Exam Updated Vital Signs BP (!) 141/83   Pulse (!) 113   Temp 97.9 F (36.6 C) (Oral)   Resp 13   Ht 5\' 4"  (1.626 m)   Wt 88.5 kg   SpO2  90%   BMI 33.47 kg/m  Physical Exam Vitals and nursing note reviewed.  Constitutional:      General: She is in acute distress.     Appearance: She is well-developed.  HENT:     Head: Normocephalic and atraumatic.  Eyes:     Conjunctiva/sclera: Conjunctivae normal.  Cardiovascular:     Rate and Rhythm: Normal rate and regular rhythm.     Heart sounds: No murmur heard. Pulmonary:     Effort: Pulmonary effort is normal. Bradypnea present. No respiratory distress.     Breath sounds: Normal breath sounds.  Abdominal:     Palpations: Abdomen is soft.     Tenderness: There is no abdominal tenderness.  Musculoskeletal:        General: No swelling.     Cervical back: Neck supple.  Skin:    General: Skin is warm and dry.     Capillary Refill: Capillary refill takes less than 2 seconds.  Neurological:     Mental Status: She is lethargic.  Psychiatric:        Mood and Affect: Mood normal.     ED Results / Procedures / Treatments   Labs (all labs ordered are listed, but only abnormal results are displayed) Labs Reviewed  CBC WITH DIFFERENTIAL/PLATELET - Abnormal; Notable for the following components:      Result Value   Hemoglobin 11.9 (*)    All other components within normal limits  BRAIN NATRIURETIC PEPTIDE - Abnormal; Notable for the following components:   B Natriuretic Peptide 128.0 (*)    All other components within normal limits  BASIC METABOLIC PANEL WITH GFR - Abnormal; Notable for the following components:   Chloride 91 (*)    Glucose, Bld 116 (*)    Creatinine, Ser 1.09 (*)    GFR, Estimated 55 (*)    All other components within normal limits  LACTIC ACID, PLASMA - Abnormal; Notable for the following components:   Lactic Acid, Venous 2.7 (*)    All other components within normal limits  LACTIC ACID, PLASMA - Abnormal; Notable for the following components:   Lactic Acid, Venous 2.9 (*)    All other components within normal limits  URINALYSIS, W/ REFLEX TO  CULTURE (INFECTION SUSPECTED) - Abnormal; Notable for the following components:   Color, Urine COLORLESS (*)    Specific Gravity, Urine 1.004 (*)    All other components within normal limits  BLOOD GAS, VENOUS - Abnormal; Notable for the following components:   pO2, Ven <31 (*)    Bicarbonate 37.6 (*)    Acid-Base Excess 11.2 (*)    All other components within normal limits  RESP PANEL BY RT-PCR (RSV, FLU A&B, COVID)  RVPGX2  CULTURE, BLOOD (ROUTINE X 2)  CULTURE, BLOOD (ROUTINE X 2)  PROTIME-INR  TROPONIN I (HIGH SENSITIVITY)  TROPONIN I (HIGH SENSITIVITY)    EKG EKG Interpretation Date/Time:  Monday July 27 2023 16:54:34 EDT Ventricular Rate:  80 PR Interval:  154 QRS Duration:  93 QT Interval:  386 QTC Calculation: 446 R Axis:   55  Text Interpretation: Sinus rhythm Atrial premature complex Confirmed by Edwin Dada ([id number]) on 07/27/2023 8:29:29 PM  Radiology DG Chest Portable 1 View Result Date: 07/27/2023 CLINICAL DATA:  Fatigue and low blood pressure, presenting with bilateral leg swelling and redness. EXAM: PORTABLE CHEST 1 VIEW COMPARISON:  July 12, 2020 FINDINGS: The cardiac silhouette is mildly enlarged and unchanged in size. Low lung volumes are noted with mild, diffusely increased interstitial lung markings. There is no evidence of acute infiltrate, pleural effusion or pneumothorax. Postoperative changes are seen throughout the cervical spine, with multilevel degenerative changes present throughout the thoracic spine. IMPRESSION: Mild, diffuse, chronic appearing increased interstitial lung markings with a superimposed component of mild interstitial edema is suspected. Electronically Signed   By: Aram Candela M.D.   On: 07/27/2023 20:22    Procedures Procedures    Medications Ordered in ED Medications  norepinephrine (LEVOPHED) 4mg  in (0.016 mg/mL) premix infusion (0 mcg/min Intravenous Hold 07/27/23 1736)  cefTRIAXone (ROCEPHIN) 1 g in sodium chloride 0.9  % 100 mL IVPB (0 g Intravenous Hold 07/27/23 1907)  naloxone (NARCAN) 4 MG/0.1ML nasal spray kit (4 mg  Provided for home use 07/27/23 1636)  cefTRIAXone (ROCEPHIN) 2 g in sodium chloride 0.9 % 100 mL IVPB (0 g Intravenous Stopped 07/27/23 1831)  azithromycin (ZITHROMAX) 500 mg in sodium chloride 0.9 % 250 mL IVPB (0 mg Intravenous Stopped 07/27/23 1935)  sodium chloride 0.9 % bolus 500 mL (500 mLs Intravenous Bolus 07/27/23 1657)  furosemide (LASIX) injection 20 mg (20 mg Intravenous Given 07/27/23 1903)    ED Course/ Medical Decision Making/ A&P                                 Medical Decision Making Amount and/or Complexity of Data Reviewed Labs: ordered. Radiology: ordered.  Risk Prescription drug management.   Patient sent in by cardiologist for evaluation for lower extremity edema/chf exacerbation.  On arrival to emergency department patient was hypotensive, hypoxic, and breathing at a rate of 12 breaths per minute.  She was lethargic, speaking with her eyes closed, and referring to herself in the third person. Patient states she takes percocets 10-325mg  4-5 times a day for the past ten years and that she took one just prior to arrival. Narcan was given with resolution of hypoxia, bradypnea, and hypotension.  No leukocytosis.  Patient originally had a sepsis workup due to unstable vital signs on arrival however I think they were more likely due to unintentional overdose.  She has no leukocytosis or signs or symptoms of sepsis at this time.  She was evaluated in the emergency department for over 5 hours with no repeat hypotension.  Her bilateral lower extremities are edematous with erythema.  Tender bilaterally.  Some concerns for possible developing cellulitis.  Antibiotics given in ED and Keflex sent to pharmacy.    Patient's vitals continue to be normal at this time.  Patient does have a lactic acidosis however I think it is from hypoxia. I am strongly favoring normal vitals were secondary  to unintentional overdose at this time.  Chest x-ray demonstrates no acute process.  EKG stable without signs of myocardial ischemia.  The rest of her laboratory studies and electrolytes are stable.  I had a long discussion with the patient regarding her her Percocet 10 325 4-5 times a day and how this can result in oversedation and hypoxia.  I recommend that she cuts back on this medication and uses other medications like gabapentin, tramadol, pain patches, or Tylenol.  Patient clearly has congestive heart failure but I do not think she requires hospitalization due to stable vitals and output with Lasix given in the ED. she had an uptrending lactic acidosis likely secondary to the hypoxia.  She originally got a 500 cc bolus due to hypotension on arrival however I do not recommend more fluids at this time due to her current fluid overloaded state.  Recommend that she follows up with her cardiologist outpatient.  Patient in no distress and overall condition improved here in the ED. Detailed discussions were had with the patient regarding current findings, and need for close f/u with PCP or on call doctor. The patient has been instructed to return immediately if the symptoms worsen in any way for re-evaluation. Patient verbalized understanding and is in agreement with current care plan. All questions answered prior to discharge.         Final Clinical Impression(s) / ED Diagnoses Final diagnoses:  Lower leg edema    Rx / DC Orders ED Discharge Orders          Ordered    cephALEXin (KEFLEX) 500 MG capsule  3 times daily        07/27/23 2106              Quinn Bucco, DO 07/27/23 2130

## 2023-07-27 NOTE — Patient Instructions (Addendum)
 Medication Instructions:  Your physician recommends that you continue on your current medications as directed. Please refer to the Current Medication list given to you today.  Labwork: None   Testing/Procedures: None   Follow-Up: Your physician recommends that you schedule a follow-up appointment in:  Sending to [facility]  1-2 weeks after discharge   Any Other Special Instructions Will Be Listed Below (If Applicable).  If you need a refill on your cardiac medications before your next appointment, please call your pharmacy.

## 2023-07-27 NOTE — Sepsis Progress Note (Signed)
 Elink monitoring for the code sepsis protocol.

## 2023-07-27 NOTE — Progress Notes (Addendum)
 Cardiology Office Note:  .   Date: 07/27/2023 ID:  Helen Mitchell, DOB 04-Sep-1955, MRN [id number] PCP: Joaquin Courts, DO  [facility] Providers Cardiologist:  Donato Schultz, MD    History of Present Illness: Helen Mitchell   Helen Mitchell is a 68 y.o. female with a PMH of minimal CAD, NSTEMI, chest discomfort, HTN, alcohol-related CM, prior alcohol withdrawal, who presents today for scheduled follow-up.   Last seen by Dr. Anne Fu on November 13, 2021. She noted having chest discomfort. Coronary CT was ordered that revealed minimal non-obstructive CAD with coronary calcium score of 176. Compression fractures were noted along thoracic vertebrae along with aortic atherosclerosis, and mild atelectasis.   11/10/2022 - Today patient presents with her caregiver, Helen Mitchell who acts as her historian.  Patient is a poor historian as she is very fatigued while I am taking her history, appears to be closing her eyes / dozing off a few times as I am speaking with her.  Patient saw her PCP on May 23 this year and appears she received a vaccine and blood work and was sick the following few days.  She was sent to [facility] and was admitted from May 27 to May 31.  Was then sent to [facility] for rehab from May 31 to June 13.  Today when asking patient how she is doing, states she has not been doing well.  Chief concern is her feet have been hurting due to leg swelling.  States torsemide has not been helping.  Helen Mitchell says while at [facility] she was treated for CHF.  Helen Mitchell also says blood pressure was elevated (238 systolic) during hospitalization at one point. She also admits to neck and back pain.  Says she broke her neck about 14 years ago, and due to this, she says she "cannot do anything."  She does admit to intermittent chest pain that is triggered by stress, difficult for her to describe to me.  When asking her where this is located she states it will happen around "her hip and will go all the way  down her legs."  Then when I asked her to point to where her chest pain is located she points along her left side of her chest, says it's nonradiating, describes it as a 10/10.  Prior to entering office room for visit, CMA notified me patient was requesting a refill for her Percocet and a prescription for Xanax. Pt admits to chronic, stable DOE. Denies any palpitations, syncope, presyncope, dizziness, orthopnea, PND, acute bleeding, or claudication.  Helen Mitchell ED visit on 01/15/2023 after a fall, X-ray of left lower leg revealed bimalleolar fractures at leel of syndemosis, bimalleolar soft tissue swelling and ankle joint effusion noted. Splint applied. Was instructed to be NWB, and to continue Aspirin 81 mg daily until date of surgery for VTE prophylaxis. Care precautions were discussed.   01/22/2023 - Patient is doing much better from when I last saw her. She is coherent, A&O x 4, and appears better from when I last saw her from a Neuro standpoint.  From a cardiac standpoint, she is doing well. Denies any chest pain, shortness of breath, palpitations, syncope, presyncope, dizziness, orthopnea, PND, significant weight changes, acute bleeding, or claudication. Does note significant swelling to LLE related to her injury. She elevates her leg PRN and has been following instructions from [facility] clinic.  Helen Mitchell her caregiver says that she will be having surgery on her left lower extremity on January 30, 2023.  Later on this month,  after this surgery, says she will be going for injections along her neck due to chronic orthopedic issues.  03/31/2023 -she presents today for follow-up with her caregiver.  Admits to chronic chest pain, described as left upper chest, Helen Mitchell says that stress brings on her symptoms.  She says relaxing/taking deep breaths helps alleviate her symptoms, Helen Mitchell says that symptoms can last for days.  Difficult for patient to describe this completely as she then starts to describe her  chronic neck/upper back pain.  Patient says she lives in chronic pain, since I last saw her she received a Botox injection for chronic pain, says this is wearing out and not working as well as it initially did.  Has been having neuropathic pain for the past 2 years.  Denies any shortness of breath, palpitations, syncope, presyncope, dizziness, orthopnea, PND, significant weight changes, acute bleeding, or claudication.  06/15/2023 -presents today for follow-up with her caregiver.  Chief complaint of feeling weak.  Admits to recent increase shortness of breath, leg swelling, and also admits to weight gain. Denies any palpitations, syncope, presyncope, dizziness, orthopnea, PND,  acute bleeding, or claudication.  Continues to admit to chronic/stable atypical chest pain-see above.   07/27/2023 - Today she presents for follow-up with her caregiver.  Patient says she feels "rotten."  Patient is very fatigued during interview and several times nods off falling asleep while I am speaking to her.  Admits to swelling and weight gain.  Compliant with her medications.  She has significant more swelling in left lower extremity than right lower extremity.  Caregiver says they were trying to get into see PCP for this, however doctor was not available for Friday appointment.  Caregiver told PCPs office that they would let provider at cardiology office know for this appointment.  She admits to pain with ambulation. Denies any chest pain, palpitations, syncope, presyncope, dizziness, orthopnea, PND, acute bleeding.  SH: Negative. See HPI.   Studies Reviewed: Helen Mitchell    EKG: EKG is not ordered today.  Vascular ultrasound lower extremity venous bilateral DVT 01/2023: RIGHT:  - No evidence of common femoral vein obstruction.  - There is no evidence of deep vein thrombosis in the lower extremity.  - There is no evidence of superficial venous thrombosis.  - No cystic structure found in the popliteal fossa.    LEFT:  - No  evidence of common femoral vein obstruction.  - There is no evidence of superficial venous thrombosis.  - There is no evidence of deep vein thrombosis in the lower extremity.  However, portions of this examination were limited- see technologist  comments above.  - No cystic structure found in the popliteal fossa.     CCTA 11/2021: IMPRESSION: 1.  Minimal nonobstructive CAD, CADRADS = 1.   2. Coronary calcium score of 176. This was 83rd percentile for age and sex matched control.   3. Normal coronary origin with right dominance.   INTERPRETATION:   CAD-RADS 1: Minimal non-obstructive CAD (1-24%). Consider non-atherosclerotic causes of chest pain. Consider preventive therapy and risk factor modification.  Echo 03/2021:   1. Left ventricular ejection fraction, by estimation, is 60 to 65%. The  left ventricle has normal function. The left ventricle has no regional  wall motion abnormalities. There is mild left ventricular hypertrophy.  Left ventricular diastolic parameters  were normal.   2. Right ventricular systolic function is normal. The right ventricular  size is normal. There is moderately elevated pulmonary artery systolic  pressure. The estimated right ventricular  systolic pressure is 45.5 mmHg.   3. Left atrial size was upper normal.   4. The mitral valve is grossly normal. Mild mitral valve regurgitation.   5. The aortic valve was not well visualized. Aortic valve regurgitation  is not visualized.   6. The inferior vena cava is normal in size with greater than 50%  respiratory variability, suggesting right atrial pressure of 3 mmHg.   Comparison(s): Prior images reviewed side by side. LVEF has normalized.  Lexiscan 09/2020: There was no ST segment deviation noted during stress. The study is normal. There are no perfusion defects consistent with prior infarct or current ischemia. This is a low risk study. The left ventricular ejection fraction is normal  (55-65%).  Physical Exam:   VS:  BP 110/70   Pulse 77   Ht 5\' 4"  (1.626 m)   Wt 195 lb (88.5 kg)   SpO2 98%   BMI 33.47 kg/m    Wt Readings from Last 3 Encounters:  07/27/23 195 lb (88.5 kg)  06/15/23 190 lb (86.2 kg)  03/31/23 181 lb 3.2 oz (82.2 kg)    GEN: Obese, 68 year old female, fatigued NECK: No JVD; No carotid bruits CARDIAC: S1/S2, RRR, no murmurs, rubs, gallops RESPIRATORY:  Clear and diminished to auscultation without rales, wheezing or rhonchi  EXTREMITIES: Nonpitting edema to bilateral lower extremity (L>R) . Redness to both lower extremities.  ASSESSMENT AND PLAN: .    Acute on chronic HFpEF, NICM, leg edema, AKI Stage C. Hx of alcohol-related CM. Last Echo on file revealed EF 60-65%.  No DVT seen on past doppler.  Does have significant more leg edema since I last saw her in the office.  Weight is up and does show signs of volume overload.  She is drowsy/lethargic during interview today.  Most recent kidney function shows evidence of AKI.  Recommended/strongly advise ED evaluation at [facility].  Offered/recommended EMS transfer. Caregiver says he will take her after this visit.  This NP has called and spoken with the ED physician at [facility], Dr. Adan Holms.  He verbalized understanding of report.  I have also made attending cardiologist aware via Epic as well as cardiologist rounding at [facility] today, Dr. Mallipeddi via secure chat.   Minimal CAD, hx of chronic chest pain Denies any chest pain. In the past, she has had stable, chronic chest pain, atypical and seemed related to her history of general chronic pain, as well as neuropathic pain. CCTA 11/2021 revealed minimal, non-obstructive CAD and to consider nonatherosclerotic causes of chest pain.  No indication for ischemic evaluation at this time.  Continue  current medication regimen.  She will be going to [facility] this afternoon for signs and symptoms of acute on chronic CHF exacerbation.   Dispo:  Follow-up with me/APP in 1 to 2 weeks after hospital discharge or sooner if any changes.  Signed, Lasalle Pointer, NP

## 2023-08-01 LAB — CULTURE, BLOOD (ROUTINE X 2)
Culture: NO GROWTH
Culture: NO GROWTH

## 2023-10-13 DEATH — deceased

## 2023-10-26 ENCOUNTER — Ambulatory Visit: Admitting: Nurse Practitioner
